# Patient Record
Sex: Female | Born: 1965 | Race: Black or African American | Hispanic: No | Marital: Married | Smoking: Never smoker
Health system: Southern US, Community
[De-identification: ages and names within clinical notes are randomized; demographics above are authoritative.]

## PROBLEM LIST (undated history)

## (undated) DIAGNOSIS — I10 Essential (primary) hypertension: Secondary | ICD-10-CM

## (undated) DIAGNOSIS — Z973 Presence of spectacles and contact lenses: Secondary | ICD-10-CM

## (undated) DIAGNOSIS — R011 Cardiac murmur, unspecified: Secondary | ICD-10-CM

## (undated) DIAGNOSIS — N200 Calculus of kidney: Secondary | ICD-10-CM

## (undated) DIAGNOSIS — Z8489 Family history of other specified conditions: Secondary | ICD-10-CM

## (undated) DIAGNOSIS — Z87442 Personal history of urinary calculi: Secondary | ICD-10-CM

## (undated) DIAGNOSIS — N201 Calculus of ureter: Secondary | ICD-10-CM

## (undated) DIAGNOSIS — G43909 Migraine, unspecified, not intractable, without status migrainosus: Secondary | ICD-10-CM

## (undated) HISTORY — DX: Calculus of kidney: N20.0

## (undated) HISTORY — PX: TONSILLECTOMY: SUR1361

---

## 2002-10-06 HISTORY — PX: ABDOMINAL HYSTERECTOMY: SHX81

## 2004-10-06 DIAGNOSIS — N200 Calculus of kidney: Secondary | ICD-10-CM

## 2004-10-06 HISTORY — DX: Calculus of kidney: N20.0

## 2014-04-17 ENCOUNTER — Encounter: Payer: Self-pay | Admitting: Internal Medicine

## 2014-06-23 ENCOUNTER — Ambulatory Visit: Payer: Self-pay | Admitting: Internal Medicine

## 2014-06-30 ENCOUNTER — Ambulatory Visit (INDEPENDENT_AMBULATORY_CARE_PROVIDER_SITE_OTHER): Payer: BC Managed Care – PPO | Admitting: Family Medicine

## 2014-06-30 ENCOUNTER — Ambulatory Visit (INDEPENDENT_AMBULATORY_CARE_PROVIDER_SITE_OTHER): Payer: BC Managed Care – PPO

## 2014-06-30 VITALS — BP 130/90 | HR 87 | Temp 98.3°F | Resp 16 | Ht 67.5 in | Wt 134.0 lb

## 2014-06-30 DIAGNOSIS — Z87442 Personal history of urinary calculi: Secondary | ICD-10-CM

## 2014-06-30 DIAGNOSIS — R109 Unspecified abdominal pain: Secondary | ICD-10-CM

## 2014-06-30 DIAGNOSIS — N201 Calculus of ureter: Secondary | ICD-10-CM

## 2014-06-30 LAB — POCT UA - MICROSCOPIC ONLY
Casts, Ur, LPF, POC: NEGATIVE
Crystals, Ur, HPF, POC: NEGATIVE
Mucus, UA: NEGATIVE
Yeast, UA: NEGATIVE

## 2014-06-30 LAB — POCT CBC
Granulocyte percent: 87 %G — AB (ref 37–80)
HCT, POC: 58.6 % — AB (ref 37.7–47.9)
Hemoglobin: 18.4 g/dL — AB (ref 12.2–16.2)
Lymph, poc: 0.4 — AB (ref 0.6–3.4)
MCH, POC: 27.2 pg (ref 27–31.2)
MCHC: 31.5 g/dL — AB (ref 31.8–35.4)
MCV: 86.3 fL (ref 80–97)
MID (CBC): 0.1 (ref 0–0.9)
MPV: 8.3 fL (ref 0–99.8)
POC Granulocyte: 3.6 (ref 2–6.9)
POC LYMPH PERCENT: 10 %L (ref 10–50)
POC MID %: 3 %M (ref 0–12)
Platelet Count, POC: 91 10*3/uL — AB (ref 142–424)
RBC: 6.79 M/uL — AB (ref 4.04–5.48)
RDW, POC: 16.1 %
WBC: 4.1 10*3/uL — AB (ref 4.6–10.2)

## 2014-06-30 LAB — POCT URINALYSIS DIPSTICK
Bilirubin, UA: NEGATIVE
Blood, UA: NEGATIVE
GLUCOSE UA: NEGATIVE
Ketones, UA: 40
Leukocytes, UA: NEGATIVE
NITRITE UA: NEGATIVE
Protein, UA: NEGATIVE
Spec Grav, UA: 1.025
UROBILINOGEN UA: 0.2
pH, UA: 5.5

## 2014-06-30 LAB — COMPLETE METABOLIC PANEL WITH GFR
ALBUMIN: 4.8 g/dL (ref 3.5–5.2)
ALK PHOS: 63 U/L (ref 39–117)
ALT: <8 U/L (ref 0–35)
AST: 19 U/L (ref 0–37)
BUN: 20 mg/dL (ref 6–23)
CO2: 28 meq/L (ref 19–32)
Calcium: 9.9 mg/dL (ref 8.4–10.5)
Chloride: 101 mEq/L (ref 96–112)
Creat: 1.26 mg/dL — ABNORMAL HIGH (ref 0.50–1.10)
GFR, EST AFRICAN AMERICAN: 57 mL/min — AB
GFR, EST NON AFRICAN AMERICAN: 50 mL/min — AB
GLUCOSE: 122 mg/dL — AB (ref 70–99)
Potassium: 4.3 mEq/L (ref 3.5–5.3)
SODIUM: 138 meq/L (ref 135–145)
Total Bilirubin: 0.4 mg/dL (ref 0.2–1.2)
Total Protein: 8.4 g/dL — ABNORMAL HIGH (ref 6.0–8.3)

## 2014-06-30 MED ORDER — HYDROCODONE-ACETAMINOPHEN 7.5-325 MG PO TABS: 1.0000 | ORAL_TABLET | Freq: Four times a day (QID) | ORAL | Status: DC | PRN

## 2014-06-30 MED ORDER — CIPROFLOXACIN HCL 500 MG PO TABS: 500.0000 mg | ORAL_TABLET | Freq: Two times a day (BID) | ORAL | Status: DC

## 2014-06-30 MED ORDER — TAMSULOSIN HCL 0.4 MG PO CAPS: 0.4000 mg | ORAL_CAPSULE | Freq: Every day | ORAL | Status: DC

## 2014-06-30 NOTE — Patient Instructions (Addendum)
Drink plenty of fluids to keep himself urinating very frequently  Take the Flomax 1 by mouth daily to try to relax no stones can pass  Strain the urine. If you should pass a stone bring it in so we can do a stone analysis on it.  Take the hydrocodone pain pills every 4-6 hours if needed for severe pain  Return in 2 days if not much better.  Return if worse at any time or go to the emergency room during the night time if needed.  Hold off on the colonoscopy if you're still having a lot of pain

## 2014-06-30 NOTE — Progress Notes (Addendum)
Subjective: 48 year old lady who has a history of kidney stones. She says she has about 11 stones in has only passed 2. She's been cared for in [place] in the past. The pain hit her in the night, up to a 9/10. She had some nausea. No hematuria. No dysuria. The pain is in her left flank and radiates down to the left lower abdomen. Last menstrual cycle was about 10 years ago.  Objective: No acute distress except for holding her flank. She says it has subsided a little right now. She has a TENS unit on. Spine nontender. Right CVA is nontender. Left CVA is quite tender. She also has tenderness in the left lower quadrant.  Her bowels did move during the night. Abdomen has active bowel sounds it is soft.  Assessment: History of kidney stones Left flank pain, presumably another kidney stone.  Plan: CBC, KUB, UA  Results for orders placed in visit on 06/30/14  POCT URINALYSIS DIPSTICK      Result Value Ref Range   Color, UA bright yellow     Clarity, UA clear     Glucose, UA neg     Bilirubin, UA neg     Ketones, UA 40     Spec Grav, UA 1.025     Blood, UA neg     pH, UA 5.5     Protein, UA neg     Urobilinogen, UA 0.2     Nitrite, UA neg     Leukocytes, UA Negative    POCT UA - MICROSCOPIC ONLY      Result Value Ref Range   WBC, Ur, HPF, POC 1-2     RBC, urine, microscopic 0-1     Bacteria, U Microscopic trace     Mucus, UA neg     Epithelial cells, urine per micros 1-3     Crystals, Ur, HPF, POC neg     Casts, Ur, LPF, POC neg     Yeast, UA neg    POCT CBC      Result Value Ref Range   WBC 4.1 (*) 4.6 - 10.2 K/uL   Lymph, poc 0.4 (*) 0.6 - 3.4   POC LYMPH PERCENT 10.0  10 - 50 %L   MID (cbc) 0.1  0 - 0.9   POC MID % 3.0  0 - 12 %M   POC Granulocyte 3.6  2 - 6.9   Granulocyte percent 87.0 (*) 37 - 80 %G   RBC 6.79 (*) 4.04 - 5.48 M/uL   Hemoglobin 18.4 (*) 12.2 - 16.2 g/dL   HCT, POC 16.1 (*) 09.6 - 47.9 %   MCV 86.3  80 - 97 fL   MCH, POC 27.2  27 - 31.2 pg   MCHC  31.5 (*) 31.8 - 35.4 g/dL   RDW, POC 04.5     Platelet Count, POC 91.0 (*) 142 - 424 K/uL   MPV 8.3  0 - 99.8 fL   [facility] reading (PRIMARY) by  Dr. Alwyn Ren Multiple stones in kidneys.  Left pelvic opacity probably distal ureteral stone.plus one other small calcification in pelvis..      Will strain her urine and see if she passes the stone.

## 2014-07-01 ENCOUNTER — Ambulatory Visit (HOSPITAL_COMMUNITY)
Admission: RE | Admit: 2014-07-01 | Discharge: 2014-07-01 | Disposition: A | Discharge status: Home / Self Care | Payer: BC Managed Care – PPO | Source: Ambulatory Visit | Attending: Family Medicine | Admitting: Family Medicine

## 2014-07-01 ENCOUNTER — Telehealth: Payer: Self-pay | Admitting: Radiology

## 2014-07-01 ENCOUNTER — Ambulatory Visit (INDEPENDENT_AMBULATORY_CARE_PROVIDER_SITE_OTHER): Payer: BC Managed Care – PPO | Admitting: Family Medicine

## 2014-07-01 ENCOUNTER — Other Ambulatory Visit: Payer: Self-pay | Admitting: Family Medicine

## 2014-07-01 VITALS — BP 138/88 | HR 77 | Temp 98.0°F

## 2014-07-01 DIAGNOSIS — N201 Calculus of ureter: Secondary | ICD-10-CM | POA: Diagnosis not present

## 2014-07-01 DIAGNOSIS — R110 Nausea: Secondary | ICD-10-CM

## 2014-07-01 DIAGNOSIS — R1013 Epigastric pain: Secondary | ICD-10-CM | POA: Diagnosis present

## 2014-07-01 DIAGNOSIS — E860 Dehydration: Secondary | ICD-10-CM

## 2014-07-01 LAB — POCT CBC
Granulocyte percent: 83.8 %G — AB (ref 37–80)
HEMATOCRIT: 41.8 % (ref 37.7–47.9)
Hemoglobin: 12.9 g/dL (ref 12.2–16.2)
Lymph, poc: 0.8 (ref 0.6–3.4)
MCH: 27 pg (ref 27–31.2)
MCHC: 30.8 g/dL — AB (ref 31.8–35.4)
MCV: 87.6 fL (ref 80–97)
MID (cbc): 0.4 (ref 0–0.9)
MPV: 8.7 fL (ref 0–99.8)
POC Granulocyte: 6.1 (ref 2–6.9)
POC LYMPH PERCENT: 10.3 %L (ref 10–50)
POC MID %: 5.9 %M (ref 0–12)
Platelet Count, POC: 174 10*3/uL (ref 142–424)
RBC: 4.77 M/uL (ref 4.04–5.48)
RDW, POC: 16.3 %
WBC: 7.3 10*3/uL (ref 4.6–10.2)

## 2014-07-01 LAB — POCT URINALYSIS DIPSTICK
Bilirubin, UA: NEGATIVE
GLUCOSE UA: NEGATIVE
KETONES UA: NEGATIVE
Leukocytes, UA: NEGATIVE
Nitrite, UA: NEGATIVE
RBC UA: NEGATIVE
Spec Grav, UA: >=1.03
UROBILINOGEN UA: 0.2
pH, UA: 5

## 2014-07-01 LAB — POCT UA - MICROSCOPIC ONLY
CRYSTALS, UR, HPF, POC: NEGATIVE
Casts, Ur, LPF, POC: NEGATIVE
MUCUS UA: POSITIVE
Yeast, UA: NEGATIVE

## 2014-07-01 MED ORDER — KETOROLAC TROMETHAMINE 60 MG/2ML IM SOLN
60.0000 mg | Freq: Once | INTRAMUSCULAR | Status: AC
  Administered 2014-07-01: 60 mg via INTRAMUSCULAR

## 2014-07-01 MED ORDER — ONDANSETRON 4 MG PO TBDP
4.0000 mg | ORAL_TABLET | Freq: Once | ORAL | Status: AC
  Administered 2014-07-01: 4 mg via ORAL

## 2014-07-01 NOTE — Telephone Encounter (Signed)
I got authorization for the CT scan, and patient went sat at 4:30pm, to you Lorain Childes, Authorization # [id number]

## 2014-07-01 NOTE — Patient Instructions (Addendum)
Go to [facility] for the CT scan, you need to register in the  Emergency  Room. Tell them you are there to register as Outpatient for CT Scan/ you may have to wait for the scan, as they have a high patient volume.  Your insurance has authorized the scan the authorization number is [id number]  Take the ciprofloxacin antibiotic twice daily. It was sent to your pharmacy last night but that was after you have gotten your medicine already I'm afraid.  Continue using the pain pills about every 4 hours as needed  You can take Aleve 2 pills twice daily in addition to the pain pills if necessary.  I have seen him a prescription for Zofran (ondansetron) that you can take every 4-6 hours as needed for nausea  Continue to try and drink lots of fluids  Return on Monday if not much better. Return at any time or go to the emergency room if worse

## 2014-07-01 NOTE — Progress Notes (Addendum)
Subjective: Patient has continued to have problems with pain in her left lower abdomen and left flank. She is nauseated. Has not been able to drink much. She rode in the car to [place] back this morning, which triggered the pain being much worse. She came in here hardly able to stand or sit up. She is holding her left side. I had seen her yesterday.  Objective: Alert and oriented. Abdomen soft. Very tender left abdomen. Moderately tender left flank.  Assessment: Left ureterolithiasis Pain Nausea Dehydration  Plan IVFluids toradol 60 im zofran 4  Results for orders placed in visit on 07/01/14  POCT CBC      Result Value Ref Range   WBC 7.3  4.6 - 10.2 K/uL   Lymph, poc 0.8  0.6 - 3.4   POC LYMPH PERCENT 10.3  10 - 50 %L   MID (cbc) 0.4  0 - 0.9   POC MID % 5.9  0 - 12 %M   POC Granulocyte 6.1  2 - 6.9   Granulocyte percent 83.8 (*) 37 - 80 %G   RBC 4.77  4.04 - 5.48 M/uL   Hemoglobin 12.9  12.2 - 16.2 g/dL   HCT, POC 08.6  57.8 - 47.9 %   MCV 87.6  80 - 97 fL   MCH, POC 27.0  27 - 31.2 pg   MCHC 30.8 (*) 31.8 - 35.4 g/dL   RDW, POC 46.9     Platelet Count, POC 174  142 - 424 K/uL   MPV 8.7  0 - 99.8 fL  POCT URINALYSIS DIPSTICK      Result Value Ref Range   Color, UA dark yellow     Clarity, UA hazy     Glucose, UA neg     Bilirubin, UA neg     Ketones, UA neg     Spec Grav, UA >=1.030     Blood, UA neg     pH, UA 5.0     Protein, UA trace     Urobilinogen, UA 0.2     Nitrite, UA neg     Leukocytes, UA Negative    POCT UA - MICROSCOPIC ONLY      Result Value Ref Range   WBC, Ur, HPF, POC 3-5     RBC, urine, microscopic 5-7     Bacteria, U Microscopic small     Mucus, UA positive     Epithelial cells, urine per micros 2-6     Crystals, Ur, HPF, POC neg     Casts, Ur, LPF, POC neg     Yeast, UA neg     Renal tubular cells       We'll send her to the hospital for an urgent CT  See instructions  Patient received care for 2 hours and 45 minutes with  numerous personal physician checks involving greater than 45 minutes face-to-face time.    Called patient back at 10 PM.  Pain gone.  CT consistent with having passed a stone.  Return if at all worse.  Try to strain urine 1-2 days.

## 2014-07-03 ENCOUNTER — Telehealth: Payer: Self-pay

## 2014-07-03 NOTE — Telephone Encounter (Signed)
I do not know specifically what else to do for her if she is continuing to have pain. I think we need to refer her on to a urologist and try and get her in there in the next day or 2 if at all possible.

## 2014-07-03 NOTE — Telephone Encounter (Signed)
Spoke to pt- she is still in pain not as bad as it was. She did not want to take the Norco because it makes her sleepy. Advised her to try just OTC Tylenol or Ibuprofen and take the Hydrocodone at night or if in severe pain. She states she has not passed any stones yet. She has been screening her urine each time.   Would pyridium help pt?

## 2014-07-03 NOTE — Telephone Encounter (Signed)
Pt would like Dr Alwyn Ren to know she is not doing well,still in a lot of pain   Best phone for pt is [phone]

## 2014-07-03 NOTE — Telephone Encounter (Signed)
Pyridium can help with any burning with urination and with the sense of urgency.

## 2014-07-04 ENCOUNTER — Telehealth: Payer: Self-pay | Admitting: Radiology

## 2014-07-04 NOTE — Telephone Encounter (Signed)
Pt is going to try the OTC Pyridium for a day or two and come back in if she is not improving.

## 2014-07-04 NOTE — Telephone Encounter (Signed)
Pt collected a kidney stone and brought it in. Kidney stone analysis ordered under last OV.

## 2014-07-04 NOTE — Addendum Note (Signed)
Addended byLevon Hedger: CLINE, REBEKAH A on: 07/04/2014 02:31 PM   Modules accepted: Orders

## 2014-07-05 ENCOUNTER — Ambulatory Visit (INDEPENDENT_AMBULATORY_CARE_PROVIDER_SITE_OTHER): Payer: BC Managed Care – PPO | Admitting: Family Medicine

## 2014-07-05 ENCOUNTER — Telehealth: Payer: Self-pay

## 2014-07-05 VITALS — BP 124/86 | HR 99 | Temp 98.3°F | Resp 16 | Ht 67.5 in | Wt 135.6 lb

## 2014-07-05 DIAGNOSIS — R1013 Epigastric pain: Secondary | ICD-10-CM

## 2014-07-05 DIAGNOSIS — R8281 Pyuria: Secondary | ICD-10-CM

## 2014-07-05 DIAGNOSIS — R109 Unspecified abdominal pain: Secondary | ICD-10-CM

## 2014-07-05 DIAGNOSIS — Z87442 Personal history of urinary calculi: Secondary | ICD-10-CM

## 2014-07-05 DIAGNOSIS — K7689 Other specified diseases of liver: Secondary | ICD-10-CM

## 2014-07-05 DIAGNOSIS — R82998 Other abnormal findings in urine: Secondary | ICD-10-CM

## 2014-07-05 LAB — POCT CBC
Granulocyte percent: 60.7 %G (ref 37–80)
HCT, POC: 38.5 % (ref 37.7–47.9)
Hemoglobin: 12 g/dL — AB (ref 12.2–16.2)
Lymph, poc: 1.1 (ref 0.6–3.4)
MCH, POC: 27.1 pg (ref 27–31.2)
MCHC: 31.2 g/dL — AB (ref 31.8–35.4)
MCV: 87 fL (ref 80–97)
MID (CBC): 0.4 (ref 0–0.9)
MPV: 7.5 fL (ref 0–99.8)
PLATELET COUNT, POC: 204 10*3/uL (ref 142–424)
POC Granulocyte: 2.4 (ref 2–6.9)
POC LYMPH PERCENT: 28.3 %L (ref 10–50)
POC MID %: 11 % (ref 0–12)
RBC: 4.43 M/uL (ref 4.04–5.48)
RDW, POC: 15.5 %
WBC: 4 10*3/uL — AB (ref 4.6–10.2)

## 2014-07-05 LAB — POCT URINALYSIS DIPSTICK
BILIRUBIN UA: NEGATIVE
Glucose, UA: NEGATIVE
KETONES UA: NEGATIVE
Leukocytes, UA: NEGATIVE
NITRITE UA: NEGATIVE
PH UA: 5.5
PROTEIN UA: 100
Spec Grav, UA: 1.025
Urobilinogen, UA: 0.2

## 2014-07-05 LAB — POCT UA - MICROSCOPIC ONLY
CASTS, UR, LPF, POC: NEGATIVE
Crystals, Ur, HPF, POC: NEGATIVE
MUCUS UA: NEGATIVE

## 2014-07-05 MED ORDER — NITROFURANTOIN MONOHYD MACRO 100 MG PO CAPS: 100.0000 mg | ORAL_CAPSULE | Freq: Two times a day (BID) | ORAL | Status: DC

## 2014-07-05 NOTE — Telephone Encounter (Signed)
Spoke to pt- she believes the Cipro is causing the hives. She has stopped medication. Pt is still having urinary symptoms and will be rtc today to see Dr. Alwyn RenHopper.

## 2014-07-05 NOTE — Addendum Note (Signed)
Addended by: Nita SellsSMITH, Kaleena Corrow S on: 07/05/2014 03:54 PM   Modules accepted: Orders

## 2014-07-05 NOTE — Patient Instructions (Signed)
Continue to drink plenty of fluids  Take Macrobid (nitrofurantoin) one twice daily with breakfast and supper  If symptoms not improved substantially by next week please return or contact me and I can make referral to a urologist.  If you're doing well, please come back in early November for a recheck of your urine and blood work.

## 2014-07-05 NOTE — Progress Notes (Addendum)
Subjective: Has felt a lot better than she continues to hurt her left side. She broke out in a rash after 3 doses of Cipro. That rash has almost subsided, but she came back in for several things. She continues to hurt a little bit on the left side. Not hurting in the flank. No hematuria or dysuria or fever or chills.  Objective: No CVA tenderness. Abdomen soft. Mild to moderate tenderness of the left lower quadrant and lateral abdomen.    Results for orders placed in visit on 07/05/14  POCT CBC      Result Value Ref Range   WBC 4.0 (*) 4.6 - 10.2 K/uL   Lymph, poc 1.1  0.6 - 3.4   POC LYMPH PERCENT 28.3  10 - 50 %L   MID (cbc) 0.4  0 - 0.9   POC MID % 11.0  0 - 12 %M   POC Granulocyte 2.4  2 - 6.9   Granulocyte percent 60.7  37 - 80 %G   RBC 4.43  4.04 - 5.48 M/uL   Hemoglobin 12.0 (*) 12.2 - 16.2 g/dL   HCT, POC 78.238.5  95.637.7 - 47.9 %   MCV 87.0  80 - 97 fL   MCH, POC 27.1  27 - 31.2 pg   MCHC 31.2 (*) 31.8 - 35.4 g/dL   RDW, POC 21.315.5     Platelet Count, POC 204  142 - 424 K/uL   MPV 7.5  0 - 99.8 fL  POCT URINALYSIS DIPSTICK      Result Value Ref Range   Color, UA yellow     Clarity, UA clear     Glucose, UA neg     Bilirubin, UA neg     Ketones, UA neg     Spec Grav, UA 1.025     Blood, UA moderate     pH, UA 5.5     Protein, UA 100     Urobilinogen, UA 0.2     Nitrite, UA neg     Leukocytes, UA Negative    POCT UA - MICROSCOPIC ONLY      Result Value Ref Range   WBC, Ur, HPF, POC 4-10     RBC, urine, microscopic 10-40     Bacteria, U Microscopic small     Mucus, UA neg     Epithelial cells, urine per micros 2-3     Crystals, Ur, HPF, POC neg     Casts, Ur, LPF, POC neg     Yeast, UA eng     Assessment: Status post kidney stone Left lower quadrant abdominal pain Microscopic hematuria Mild pyuria Liver cysts  Plan: Continue her on Macrobid for a possible infection. Reculture her urine. Have her return in early November. Come back if she's not improving. I  think this is all post stone pain with a little inflammation still in the ureter from the stone, though I am not certain of that. If she is doing worse or persisting we consider a urologist.  Explained to her about the small cysts in her liver with. These are benign in appearance and did not pursue work this time. Next week.

## 2014-07-05 NOTE — Telephone Encounter (Signed)
Patient says she is having a reaction to her newly prescribed medication. She says she is breaking out in small itchy bumps. I advised patient to RTC, she declined.

## 2014-07-06 LAB — URINE CULTURE
Colony Count: NO GROWTH
ORGANISM ID, BACTERIA: NO GROWTH

## 2014-07-08 LAB — STONE ANALYSIS: Stone Weight KSTONE: 0.028 g

## 2015-02-28 ENCOUNTER — Ambulatory Visit (INDEPENDENT_AMBULATORY_CARE_PROVIDER_SITE_OTHER): Payer: Worker's Compensation | Admitting: Family Medicine

## 2015-02-28 ENCOUNTER — Ambulatory Visit: Payer: Worker's Compensation

## 2015-02-28 VITALS — BP 128/82 | HR 83 | Temp 98.1°F | Resp 16 | Ht 68.0 in | Wt 139.0 lb

## 2015-02-28 DIAGNOSIS — M25562 Pain in left knee: Secondary | ICD-10-CM

## 2015-02-28 DIAGNOSIS — S8002XA Contusion of left knee, initial encounter: Secondary | ICD-10-CM

## 2015-02-28 NOTE — Progress Notes (Signed)
Rose Wilson January 04, 1966 49 y.o.   Chief Complaint  Patient presents with  . Knee Injury    l knee workers comp     Date of Injury: yesterday- 02/27/2015  History of Present Illness:  Presents for evaluation of work-related complaint. Yesterday at work she missed the last step while walking to the parking lot and fell.  She was holding on to the railing and is not sure quite how she fell.  Landed on her left knee.  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  She thought she was ok, but the knee hurt more as the night went on and she felt it needed to be looked at today.   She notes a little bit of swelling.  She has tried ice, she is able to walk but it hurts to go up stairs.  No clicking or popping, no locking up.  The knee feels a bit unstable when he has to get up from sitting to standing   She does do a lot of walking at her job.  She works for the [employer].  She is generally in good health   ROS    Allergies  Allergen Reactions  . Ciprofloxacin Hcl      Current medications reviewed and updated. Past medical history, family history, social history have been reviewed and updated.   Physical Exam  GEN: WDWN, NAD, Non-toxic, A & O x 3, looks well HEENT: Atraumatic, Normocephalic. Neck supple. No masses, No LAD. Ears and Nose: No external deformity. CV: RRR, No M/G/R. No JVD. No thrill. No extra heart sounds. PULM: CTA B, no wheezes, crackles, rhonchi. No retractions. No resp. distress. No accessory muscle use. EXTR: No c/c/e NEURO Normal gait. Not visibly favoring left PSYCH: Normally interactive. Conversant. Not depressed or anxious appearing.  Calm demeanor.  Left knee: she has a contusion of the very proximal tibia just under the joint line, more medial aspect.  This is actually where she hurts.  Normal ROM of the knee but she does have mild crepitus bilaterally.  Knee is stable, no laxity.  No effusion.  Minimal swelling at the site of contusion   [facility] reading (PRIMARY) by  Dr.  Patsy Lageropland. Left knee: negative  LEFT KNEE - COMPLETE 4+ VIEW  COMPARISON: None.  FINDINGS: The knee joint spaces appear normal. No fracture is seen. No joint effusion is noted. The patella is normally positioned.  IMPRESSION: Negative.  Assessment and Plan: Contusion of left knee, initial encounter - Plan: DG Knee Complete 4 Views Left  Pain in left knee - Plan: DG Knee Complete 4 Views Left  Pain and contusion of left knee from a fall yesterday.  Restrictions at work, NSAIDs, ice, recheck in one week.

## 2015-02-28 NOTE — Patient Instructions (Signed)
Take it easy on your knee- avoid activities that hurt your knee.   Avoid climbing stairs.   Apply ice a couple of times a day and try NSAIDs such as aleve or ibuprofen Please see me in one week for a recheck

## 2015-03-07 ENCOUNTER — Ambulatory Visit (INDEPENDENT_AMBULATORY_CARE_PROVIDER_SITE_OTHER): Payer: Worker's Compensation | Admitting: Family Medicine

## 2015-03-07 VITALS — BP 122/74 | HR 81 | Temp 99.0°F | Resp 17 | Ht 68.5 in | Wt 140.0 lb

## 2015-03-07 DIAGNOSIS — S86912D Strain of unspecified muscle(s) and tendon(s) at lower leg level, left leg, subsequent encounter: Secondary | ICD-10-CM

## 2015-03-07 DIAGNOSIS — S86812D Strain of other muscle(s) and tendon(s) at lower leg level, left leg, subsequent encounter: Secondary | ICD-10-CM | POA: Diagnosis not present

## 2015-03-07 NOTE — Patient Instructions (Signed)
I am glad that your knee is improved!  Please let me know if you have any recurrent problems with your knee

## 2015-03-07 NOTE — Progress Notes (Signed)
Rose Wilson [birth year] 49 y.o.   Chief Complaint  Patient presents with  . Follow-up    knee pain      Date of Injury: 02/26/14  History of Present Illness:  Presents for evaluation of work-related complaint. Seen here on 5/25- the day prior she missed a step and fell onto her left knee.  She had negative films.  Gave work restrions, RICE.  She states that her left knee is just a little bit sore now, but "iIm ok."   No clicking, popping or getting stuck, no instability.  She would like to return to full duty   ROS    Allergies  Allergen Reactions  . Ciprofloxacin Hcl      Current medications reviewed and updated. Past medical history, family history, social history have been reviewed and updated.   Physical Exam  GEN: WDWN, NAD, Non-toxic, A & O x 3, looks well HEENT: Atraumatic, Normocephalic. Neck supple. No masses, No LAD. Ears and Nose: No external deformity. CV: RRR, No M/G/R. No JVD. No thrill. No extra heart sounds. PULM: CTA B, no wheezes, crackles, rhonchi. No retractions. No resp. distress. No accessory muscle use. EXTR: No c/c/e NEURO Normal gait.  PSYCH: Normally interactive. Conversant. Not depressed or anxious appearing.  Calm demeanor.  Left knee: no tenderness at joint line to palpation.  Normal ligament stability. No effusion, redness or swelling, no heat  Assessment and Plan: Knee strain, left, subsequent encounter  Much improved- she may return to full duty.  Follow-up if needed

## 2016-06-26 ENCOUNTER — Other Ambulatory Visit: Payer: Self-pay | Admitting: Specialist

## 2016-06-26 DIAGNOSIS — R519 Headache, unspecified: Secondary | ICD-10-CM

## 2016-06-26 DIAGNOSIS — R51 Headache: Principal | ICD-10-CM

## 2016-07-04 ENCOUNTER — Other Ambulatory Visit: Payer: Self-pay

## 2016-08-09 ENCOUNTER — Inpatient Hospital Stay: Admission: RE | Admit: 2016-08-09 | Payer: Self-pay | Source: Ambulatory Visit

## 2017-04-07 ENCOUNTER — Other Ambulatory Visit: Payer: Self-pay | Admitting: Gynecology

## 2017-04-07 DIAGNOSIS — N9489 Other specified conditions associated with female genital organs and menstrual cycle: Secondary | ICD-10-CM

## 2017-04-15 ENCOUNTER — Ambulatory Visit
Admission: RE | Admit: 2017-04-15 | Discharge: 2017-04-15 | Disposition: A | Discharge status: Home / Self Care | Payer: BLUE CROSS/BLUE SHIELD | Source: Ambulatory Visit | Attending: Gynecology | Admitting: Gynecology

## 2017-04-15 DIAGNOSIS — N9489 Other specified conditions associated with female genital organs and menstrual cycle: Secondary | ICD-10-CM

## 2017-07-31 ENCOUNTER — Other Ambulatory Visit: Payer: Self-pay | Admitting: Internal Medicine

## 2017-07-31 ENCOUNTER — Ambulatory Visit
Admission: RE | Admit: 2017-07-31 | Discharge: 2017-07-31 | Disposition: A | Discharge status: Home / Self Care | Payer: BLUE CROSS/BLUE SHIELD | Source: Ambulatory Visit | Attending: Internal Medicine | Admitting: Internal Medicine

## 2017-07-31 DIAGNOSIS — Z0000 Encounter for general adult medical examination without abnormal findings: Secondary | ICD-10-CM

## 2018-08-20 ENCOUNTER — Ambulatory Visit
Admission: RE | Admit: 2018-08-20 | Discharge: 2018-08-20 | Disposition: A | Discharge status: Home / Self Care | Payer: BLUE CROSS/BLUE SHIELD | Source: Ambulatory Visit | Attending: Physician Assistant | Admitting: Physician Assistant

## 2018-08-20 ENCOUNTER — Other Ambulatory Visit: Payer: Self-pay | Admitting: Physician Assistant

## 2018-08-20 DIAGNOSIS — R109 Unspecified abdominal pain: Secondary | ICD-10-CM

## 2018-08-23 ENCOUNTER — Encounter (HOSPITAL_COMMUNITY)
Admission: RE | Disposition: A | Discharge status: Home / Self Care | Payer: Self-pay | Source: Ambulatory Visit | Attending: Urology

## 2018-08-23 ENCOUNTER — Ambulatory Visit (HOSPITAL_COMMUNITY): Payer: BLUE CROSS/BLUE SHIELD | Admitting: Anesthesiology

## 2018-08-23 ENCOUNTER — Encounter (HOSPITAL_COMMUNITY): Payer: Self-pay | Admitting: *Deleted

## 2018-08-23 ENCOUNTER — Ambulatory Visit (HOSPITAL_COMMUNITY)
Admission: RE | Admit: 2018-08-23 | Discharge: 2018-08-23 | Disposition: A | Discharge status: Home / Self Care | Payer: BLUE CROSS/BLUE SHIELD | Source: Ambulatory Visit | Attending: Urology | Admitting: Urology

## 2018-08-23 ENCOUNTER — Ambulatory Visit (HOSPITAL_COMMUNITY): Payer: BLUE CROSS/BLUE SHIELD

## 2018-08-23 ENCOUNTER — Other Ambulatory Visit: Payer: Self-pay | Admitting: Urology

## 2018-08-23 DIAGNOSIS — N201 Calculus of ureter: Secondary | ICD-10-CM | POA: Diagnosis present

## 2018-08-23 DIAGNOSIS — N202 Calculus of kidney with calculus of ureter: Secondary | ICD-10-CM | POA: Insufficient documentation

## 2018-08-23 HISTORY — DX: Cardiac murmur, unspecified: R01.1

## 2018-08-23 HISTORY — DX: Personal history of urinary calculi: Z87.442

## 2018-08-23 HISTORY — PX: CYSTOSCOPY W/ URETERAL STENT PLACEMENT: SHX1429

## 2018-08-23 HISTORY — DX: Family history of other specified conditions: Z84.89

## 2018-08-23 SURGERY — CYSTOSCOPY, WITH RETROGRADE PYELOGRAM AND URETERAL STENT INSERTION
Anesthesia: General | Site: Ureter | Laterality: Left

## 2018-08-23 MED ORDER — MIDAZOLAM HCL 2 MG/2ML IJ SOLN
INTRAMUSCULAR | Status: AC
  Filled 2018-08-23: qty 2

## 2018-08-23 MED ORDER — ONDANSETRON HCL 4 MG/2ML IJ SOLN
INTRAMUSCULAR | Status: AC
  Filled 2018-08-23: qty 2

## 2018-08-23 MED ORDER — ONDANSETRON HCL 4 MG/2ML IJ SOLN
INTRAMUSCULAR | Status: DC | PRN
  Administered 2018-08-23: 4 mg via INTRAVENOUS

## 2018-08-23 MED ORDER — SODIUM CHLORIDE 0.9 % IR SOLN
Status: DC | PRN
  Administered 2018-08-23: 4000 mL

## 2018-08-23 MED ORDER — MIDAZOLAM HCL 5 MG/5ML IJ SOLN
INTRAMUSCULAR | Status: DC | PRN
  Administered 2018-08-23: 2 mg via INTRAVENOUS

## 2018-08-23 MED ORDER — CEPHALEXIN 500 MG PO CAPS: 500.0000 mg | ORAL_CAPSULE | Freq: Two times a day (BID) | ORAL | 0 refills | Status: AC

## 2018-08-23 MED ORDER — FENTANYL CITRATE (PF) 100 MCG/2ML IJ SOLN
INTRAMUSCULAR | Status: AC
  Filled 2018-08-23: qty 2

## 2018-08-23 MED ORDER — SCOPOLAMINE 1 MG/3DAYS TD PT72
MEDICATED_PATCH | TRANSDERMAL | Status: DC | PRN
  Administered 2018-08-23: 1 via TRANSDERMAL

## 2018-08-23 MED ORDER — PROPOFOL 10 MG/ML IV BOLUS
INTRAVENOUS | Status: DC | PRN
  Administered 2018-08-23: 160 mg via INTRAVENOUS

## 2018-08-23 MED ORDER — DIPHENHYDRAMINE HCL 50 MG/ML IJ SOLN
INTRAMUSCULAR | Status: DC | PRN
  Administered 2018-08-23: 12.5 mg via INTRAVENOUS

## 2018-08-23 MED ORDER — DEXAMETHASONE SODIUM PHOSPHATE 10 MG/ML IJ SOLN
INTRAMUSCULAR | Status: DC | PRN
  Administered 2018-08-23: 10 mg via INTRAVENOUS

## 2018-08-23 MED ORDER — PROPOFOL 10 MG/ML IV BOLUS
INTRAVENOUS | Status: AC
  Filled 2018-08-23: qty 20

## 2018-08-23 MED ORDER — LIDOCAINE 2% (20 MG/ML) 5 ML SYRINGE
INTRAMUSCULAR | Status: DC | PRN
  Administered 2018-08-23: 100 mg via INTRAVENOUS

## 2018-08-23 MED ORDER — SCOPOLAMINE 1 MG/3DAYS TD PT72
MEDICATED_PATCH | TRANSDERMAL | Status: AC
  Filled 2018-08-23: qty 1

## 2018-08-23 MED ORDER — HYDROMORPHONE HCL 1 MG/ML IJ SOLN: 0.2500 mg | INTRAMUSCULAR | Status: DC | PRN

## 2018-08-23 MED ORDER — SODIUM CHLORIDE 0.9 % IV SOLN
INTRAVENOUS | Status: DC | PRN
  Administered 2018-08-23: 10 mL

## 2018-08-23 MED ORDER — LACTATED RINGERS IV SOLN
INTRAVENOUS | Status: DC
Start: 2018-08-23 — End: 2018-08-24
  Administered 2018-08-23: 16:00:00 via INTRAVENOUS

## 2018-08-23 MED ORDER — FENTANYL CITRATE (PF) 100 MCG/2ML IJ SOLN
INTRAMUSCULAR | Status: DC | PRN
  Administered 2018-08-23 (×2): 50 ug via INTRAVENOUS

## 2018-08-23 MED ORDER — DEXAMETHASONE SODIUM PHOSPHATE 10 MG/ML IJ SOLN
INTRAMUSCULAR | Status: AC
  Filled 2018-08-23: qty 1

## 2018-08-23 MED ORDER — 0.9 % SODIUM CHLORIDE (POUR BTL) OPTIME
TOPICAL | Status: DC | PRN
  Administered 2018-08-23: 1000 mL

## 2018-08-23 MED ORDER — LIP MEDEX EX OINT
TOPICAL_OINTMENT | CUTANEOUS | Status: AC
  Filled 2018-08-23: qty 7

## 2018-08-23 MED ORDER — CEFAZOLIN SODIUM-DEXTROSE 2-4 GM/100ML-% IV SOLN
2.0000 g | INTRAVENOUS | Status: AC
  Administered 2018-08-23: 2 g via INTRAVENOUS
  Filled 2018-08-23: qty 100

## 2018-08-23 MED ORDER — PROMETHAZINE HCL 25 MG/ML IJ SOLN: 6.2500 mg | INTRAMUSCULAR | Status: DC | PRN

## 2018-08-23 MED ORDER — DIPHENHYDRAMINE HCL 50 MG/ML IJ SOLN
INTRAMUSCULAR | Status: AC
  Filled 2018-08-23: qty 1

## 2018-08-23 SURGICAL SUPPLY — 19 items
BAG URO CATCHER STRL LF (MISCELLANEOUS) ×3 IMPLANT
CATH URET 5FR 28IN OPEN ENDED (CATHETERS) ×3 IMPLANT
CLOTH BEACON ORANGE TIMEOUT ST (SAFETY) ×3 IMPLANT
DRSG TEGADERM 4X4.75 (GAUZE/BANDAGES/DRESSINGS) ×3 IMPLANT
EXTRACTOR STONE NITINOL NGAGE (UROLOGICAL SUPPLIES) ×3 IMPLANT
GLOVE BIOGEL PI IND STRL 7.5 (GLOVE) ×2 IMPLANT
GLOVE BIOGEL PI INDICATOR 7.5 (GLOVE) ×4
GLOVE SURG SS PI 7.5 STRL IVOR (GLOVE) ×3 IMPLANT
GLOVE SURG SS PI 8.0 STRL IVOR (GLOVE) ×3 IMPLANT
GOWN SPEC L3 XXLG W/TWL (GOWN DISPOSABLE) ×3 IMPLANT
GOWN STRL REUS W/TWL XL LVL3 (GOWN DISPOSABLE) ×3 IMPLANT
GUIDEWIRE STR DUAL SENSOR (WIRE) ×6 IMPLANT
KIT BALLN UROMAX 15FX4 (MISCELLANEOUS) ×1 IMPLANT
KIT BALLN UROMAX 26 75X4 (MISCELLANEOUS) ×2
MANIFOLD NEPTUNE II (INSTRUMENTS) ×3 IMPLANT
PACK CYSTO (CUSTOM PROCEDURE TRAY) ×3 IMPLANT
STENT URET 6FRX24 CONTOUR (STENTS) ×3 IMPLANT
TUBING CONNECTING 10 (TUBING) ×2 IMPLANT
TUBING CONNECTING 10' (TUBING) ×1

## 2018-08-23 NOTE — Anesthesia Preprocedure Evaluation (Addendum)
Anesthesia Evaluation  Patient identified by MRN, date of birth, ID band Patient awake    Reviewed: Allergy & Precautions, NPO status , Patient's Chart, lab work & pertinent test results  History of Anesthesia Complications Negative for: history of anesthetic complications  Airway Mallampati: II  TM Distance: >3 FB Neck ROM: Full    Dental no notable dental hx. (+) Dental Advisory Given   Pulmonary neg pulmonary ROS,    Pulmonary exam normal        Cardiovascular negative cardio ROS Normal cardiovascular exam     Neuro/Psych negative neurological ROS  negative psych ROS   GI/Hepatic negative GI ROS, Neg liver ROS,   Endo/Other  negative endocrine ROS  Renal/GU   negative genitourinary   Musculoskeletal negative musculoskeletal ROS (+)   Abdominal   Peds negative pediatric ROS (+)  Hematology negative hematology ROS (+)   Anesthesia Other Findings   Reproductive/Obstetrics negative OB ROS                            Anesthesia Physical Anesthesia Plan  ASA: II  Anesthesia Plan: General   Post-op Pain Management:    Induction: Intravenous  PONV Risk Score and Plan: 4 or greater and Ondansetron, Dexamethasone, Scopolamine patch - Pre-op and Diphenhydramine  Airway Management Planned: LMA  Additional Equipment:   Intra-op Plan:   Post-operative Plan: Extubation in OR  Informed Consent: I have reviewed the patients History and Physical, chart, labs and discussed the procedure including the risks, benefits and alternatives for the proposed anesthesia with the patient or authorized representative who has indicated his/her understanding and acceptance.   Dental advisory given  Plan Discussed with: CRNA and Anesthesiologist  Anesthesia Plan Comments:        Anesthesia Quick Evaluation

## 2018-08-23 NOTE — Anesthesia Procedure Notes (Signed)
Procedure Name: LMA Insertion Date/Time: 08/23/2018 5:36 PM Performed by: Avory Rahimi D, CRNA Pre-anesthesia Checklist: Patient identified, Emergency Drugs available, Suction available and Patient being monitored Patient Re-evaluated:Patient Re-evaluated prior to induction Oxygen Delivery Method: Circle system utilized Preoxygenation: Pre-oxygenation with 100% oxygen Induction Type: IV induction Ventilation: Mask ventilation without difficulty LMA: LMA inserted LMA Size: 4.0 Tube type: Oral Number of attempts: 1 Placement Confirmation: positive ETCO2 and breath sounds checked- equal and bilateral Tube secured with: Tape Dental Injury: Teeth and Oropharynx as per pre-operative assessment

## 2018-08-23 NOTE — Anesthesia Postprocedure Evaluation (Signed)
Anesthesia Post Note  Patient: Rose Wilson  Procedure(s) Performed: CYSTOSCOPY, URETEROSCOPY WITH STONE EXCAVATION AND LEFT URETERAL STENT PLACEMENT (Left Ureter)     Patient location during evaluation: PACU Anesthesia Type: General Level of consciousness: sedated Pain management: pain level controlled Vital Signs Assessment: post-procedure vital signs reviewed and stable Respiratory status: spontaneous breathing and respiratory function stable Cardiovascular status: stable Postop Assessment: no apparent nausea or vomiting Anesthetic complications: no    Last Vitals:  Vitals:   08/23/18 1900 08/23/18 1946  BP: 128/80 123/73  Pulse: 81 87  Resp: 15   Temp: 37 C 37.2 C  SpO2: 95% 96%    Last Pain:  Vitals:   08/23/18 1946  TempSrc:   PainSc: 0-No pain                 Ezell Poke DANIEL

## 2018-08-23 NOTE — Discharge Instructions (Signed)
1. You may see some blood in the urine and may have some burning with urination for 48-72 hours. You also may notice that you have to urinate more frequently or urgently after your procedure which is normal.  2. You should call the office [phone]) if you develop an inability urinate, fever > 101, persistent nausea and vomiting that prevents you from eating or drinking to stay hydrated.  3. If you have a stent, you will likely urinate more frequently and urgently until the stent is removed and you may experience some discomfort/pain in the lower abdomen and flank especially when urinating. You may take pain medication prescribed to you if needed for pain. You may also intermittently have blood in the urine until the stent is removed. 4. On Thursday morning, remove the stent by pulling on the string. If you are not comfortable removing your stent at home, please call our clinic to arrange an appointment to have a nurse remove the stent. Ureteral stents are temporary and, if left in place, permanent damage can occur. If you are unable to find the string, call our office to come in and have the stent removed.

## 2018-08-23 NOTE — Transfer of Care (Signed)
Immediate Anesthesia Transfer of Care Note  Patient: Rose Wilson  Procedure(s) Performed: CYSTOSCOPY, URETEROSCOPY WITH STONE EXCAVATION AND LEFT URETERAL STENT PLACEMENT (Left Ureter)  Patient Location: PACU  Anesthesia Type:General  Level of Consciousness: awake  Airway & Oxygen Therapy: Patient Spontanous Breathing and Patient connected to face mask oxygen  Post-op Assessment: Report given to RN and Post -op Vital signs reviewed and stable  Post vital signs: Reviewed and stable  Last Vitals:  Vitals Value Taken Time  BP 130/92 08/23/2018  6:24 PM  Temp 37.2 C 08/23/2018  6:24 PM  Pulse 82 08/23/2018  6:25 PM  Resp 19 08/23/2018  6:25 PM  SpO2 100 % 08/23/2018  6:25 PM  Vitals shown include unvalidated device data.  Last Pain:  Vitals:   08/23/18 1824  TempSrc:   PainSc: (P) 0-No pain         Complications: No apparent anesthesia complications

## 2018-08-23 NOTE — H&P (Signed)
CC: I have ureteral stone.  HPI: Rose Wilson is a 52 year-old female patient who was referred by Dr. Margaretha Sheffield, MD who is here for ureteral stone.  The problem is on the left side.   Rose Wilson is a 52 yo female who is sent in consultation by Shanon Rosser PA. She had the onset of left flank pain that was severe on 08/16/18. She has a prior history of stones. A CT on 11/15 showed a 50m left distal stone. She is uncomfortable today with nausea. She has had no hematuria or voiding symptoms. She has had no fever.      ALLERGIES: No Allergies    MEDICATIONS: Tamsulosin Hcl 0.4 mg capsule  Hydrocodone-Acetaminophen 7.5 mg-325 mg tablet     GU PSH: Hysterectomy Unilat SO - 2009      PSH Notes: Hysterectomy   NON-GU PSH: None   GU PMH: Chronic cystitis (w/o hematuria), Chronic cystitis - 2014 Renal calculus, Nephrolithiasis - 2014 Ureteral calculus, Calculus of ureter - 2014      PMH Notes:  1898-10-06 00:00:00 - Note: Normal Routine History And Physical Adult   NON-GU PMH: None   FAMILY HISTORY: Diverticulitis Of Colon - Mother Family Health Status - Father alive at age 54- R65In Family Family Health Status - Mother's Age - Runs In Family Family Health Status Number - Runs In Family Hypertension - Father, Mother, Daughter nephrolithiasis - Mother Pure Hypercholesterolemia - Mother   SOCIAL HISTORY: Marital Status: Married Preferred Language: English; Race: Black or African American Current Smoking Status: Patient has never smoked.   Tobacco Use Assessment Completed: Used Tobacco in last 30 days? Does not drink caffeine.     Notes: 1 daughter   REVIEW OF SYSTEMS:    GU Review Female:   Patient reports get up at night to urinate. Patient denies frequent urination, hard to postpone urination, burning /pain with urination, leakage of urine, stream starts and stops, trouble starting your stream, have to strain to urinate, and being pregnant.  Gastrointestinal  (Upper):   Patient reports nausea. Patient denies vomiting and indigestion/ heartburn.  Gastrointestinal (Lower):   Patient denies diarrhea and constipation.  Constitutional:   Patient reports night sweats and fatigue. Patient denies fever and weight loss.  Skin:   Patient denies skin rash/ lesion and itching.  Eyes:   Patient denies blurred vision and double vision.  Ears/ Nose/ Throat:   Patient denies sore throat and sinus problems.  Hematologic/Lymphatic:   Patient denies swollen glands and easy bruising.  Cardiovascular:   Patient denies leg swelling and chest pains.  Respiratory:   Patient denies cough and shortness of breath.  Endocrine:   Patient denies excessive thirst.  Musculoskeletal:   Patient reports back pain. Patient denies joint pain.  Neurological:   Patient denies headaches and dizziness.  Psychologic:   Patient denies depression and anxiety.   VITAL SIGNS:      08/23/2018 11:21 AM  Weight 150 lb / 68.04 kg  Height 67 in / 170.18 cm  BP 137/87 mmHg  Pulse 86 /min  Temperature 98.5 F / 36.9 C  BMI 23.5 kg/m   MULTI-SYSTEM PHYSICAL EXAMINATION:    Constitutional: Well-nourished. No physical deformities. Normally developed. Good grooming.  Neck: Neck symmetrical, not swollen. Normal tracheal position.  Respiratory: Normal breath sounds. No labored breathing, no use of accessory muscles.   Cardiovascular: Regular rate and rhythm. No murmur, no gallop.   Lymphatic: No enlargement, no tenderness of supraclavicular or  neck lymph nodes.  Skin: No paleness, no jaundice, no cyanosis. No lesion, no ulcer, no rash.  Neurologic / Psychiatric: Oriented to time, oriented to place, oriented to person. No depression, no anxiety, no agitation.  Gastrointestinal: Abdominal tenderness, left flank. No mass, no rigidity, non obese abdomen.   Musculoskeletal: Normal gait and station of head and neck.     PAST DATA REVIEWED:  Source Of History:  Patient  Records Review:   Previous  Doctor Records, Previous Patient Records  Urine Test Review:   Urinalysis  X-Ray Review: C.T. Stone Protocol: Reviewed Films. Reviewed Report. Discussed With Patient. She has a 78m left distal stone and bilateral renal stones with a 925mright mid to lower pole stone.    Notes:                     Prior 24hr urine showed low urine volume in 2010 of about 0.9 - 1 liter.    PROCEDURES:         KUB - [id number] single view of the abdomen is obtained.               Urinalysis w/Scope Dipstick Dipstick Cont'd Micro  Color: Yellow Bilirubin: Neg mg/dL WBC/hpf: NS (Not Seen)  Appearance: Clear Ketones: Neg mg/dL RBC/hpf: 20 - 40/hpf  Specific Gravity: 1.025 Blood: 3+ ery/uL Bacteria: NS (Not Seen)  pH: <=5.0 Protein: Trace mg/dL Cystals: NS (Not Seen)  Glucose: Neg mg/dL Urobilinogen: 0.2 mg/dL Casts: NS (Not Seen)    Nitrites: Neg Trichomonas: Not Present    Leukocyte Esterase: Neg leu/uL Mucous: Present      Epithelial Cells: 0 - 5/hpf      Yeast: NS (Not Seen)      Sperm: Not Present    ASSESSMENT:      ICD-10 Details  1 GU:   Ureteral calculus - N20.1 Her stone has moved minimally. I discussed further MET, URS and ESWL and will proceed with URS later today. I have reviewed the risks of ureteroscopy including bleeding, infection, ureteral injury, need for a stent or secondary procedures, thrombotic events and anesthetic complications.   2   Renal calculus - N20.0 Worsening - She has bilateral renal stones and should have a repeat metabolic evaluation after recovery from the acute episode.    PLAN:           Orders X-Rays: KUB          Schedule Return Visit/Planned Activity: ASAP - Schedule Surgery          Document Letter(s):  Created for Patient: Clinical Summary         Notes:   CC: Scott Long PA and DaMargaretha SheffieldD.        Next Appointment:      Next Appointment: 09/08/2018 12:45 PM    Appointment Type: Postoperative Appointment    Location: [facility] - [phone]  Provider: BrAzucena Fallen  [facility]

## 2018-08-24 ENCOUNTER — Encounter (HOSPITAL_COMMUNITY): Payer: Self-pay | Admitting: Urology

## 2018-08-24 LAB — HEMOGLOBIN: Hemoglobin: 11.1 g/dL — ABNORMAL LOW (ref 12.0–15.0)

## 2018-08-24 NOTE — Op Note (Addendum)
Procedure: Cystoscopy with left ureteroscopic stone extraction with insertion of left double-J stent.  Preop diagnosis: Left distal ureteral stone.  Postop diagnosis: Same.  Surgeon: Dr. Irine Seal.  Resident physician: Dr. Andee Poles.  Anesthesia: General.  Drains: 6 French by 24 cm left contour double-J stent with tether.  Specimen: Stone.  EBL: None.  Complications: None.  Indications: Rose Wilson is a 52 year old African-American female who has a 4 mm left distal ureteral stone with pain.  She is elected to undergo ureteroscopy for management.  Procedure: She was given 2 g of Ancef.  She was taken operating room where general anesthetic was induced.  She was placed in lithotomy position and fitted with PAS hose.  Her perineum and genitalia were prepped with Betadine solution and she was draped in usual sterile fashion.  Cystoscopy was performed using the 23 [place] scope and 30 degree lens.  Examination revealed a normal urethra.  The bladder wall was smooth and pale without tumor stones or inflammation.  Ureteral orifices were unremarkable.  The left ureteral orifice was cannulated with a sensor guidewire that is been stiffen by a 6 [place] open end catheter.  The wire was easily passed by the stone to the kidney.  Paced feel blood was noted to effluxed from the ureteral orifice after passage.  A 15 French by 4 cm high-pressure balloon was passed over the wire and dilated to 20 atm of pressure across the ureteral meatus up to the level of the stone.  The cystoscope was removed and a 6.5 French semirigid ureteroscope was passed alongside the wire.  The stone was identified and grasped with an engage basket.  The stone was removed intact.  The cystoscope was reinserted over the wire and a 6 [place] by 24 cm contour double-J stent was passed to the kidney under fluoroscopic guidance.  Wire was removed leaving a good coil in the kidney and a good coil in the bladder.  The cystoscope  was removed leaving the stent string exiting the urethra.  The string was secured to the patient.  She was taken down from lithotomy position, her anesthetic was reversed and she was moved to recovery room in stable condition.  There were no complications.  She was given her stone to bring to the office.

## 2019-05-13 IMAGING — CT CT ABD-PELV W/O CM
2 of 4 series · 13 of 46 positions shown, 15 images · non-contrast
Comparison: 07/01/2014

CLINICAL DATA: Left flank pain for several days

EXAM:
CT ABDOMEN AND PELVIS WITHOUT CONTRAST
TECHNIQUE: Multidetector CT imaging of the abdomen and pelvis was performed
following the standard protocol without IV contrast.

[Series 2: renal stone 5.00 br40 s3 ax · axial · 0.58mm/px · z∈[+1370,+1755]mm · 10 of 93 slices shown, 12 images]
[im 8/93  soft-tissue]
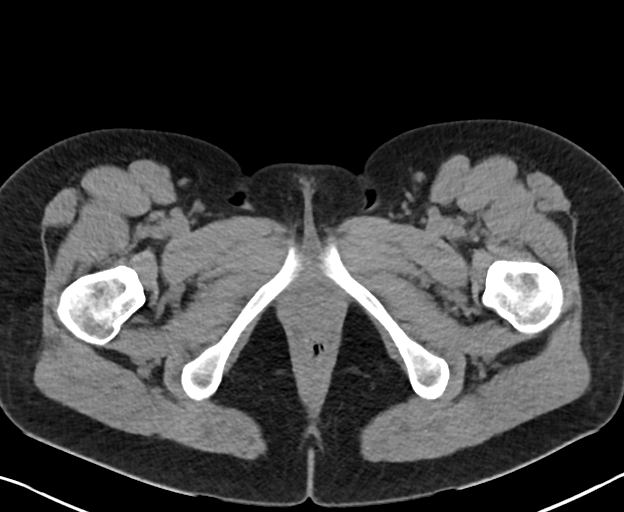
[im 8/93  bone]
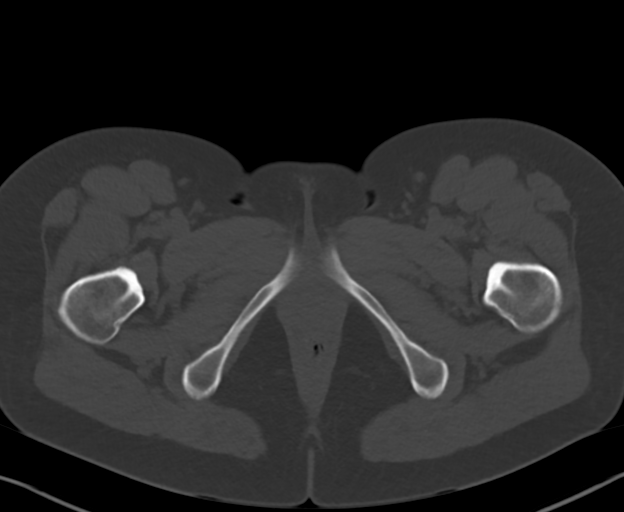
[im 16/93  soft-tissue]
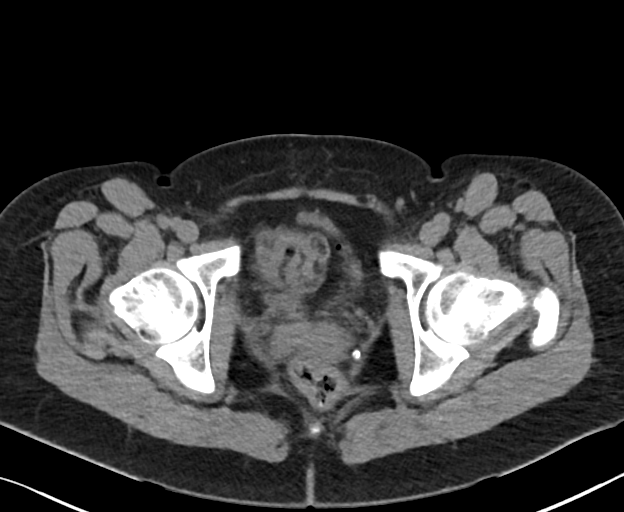
[im 24/93  soft-tissue]
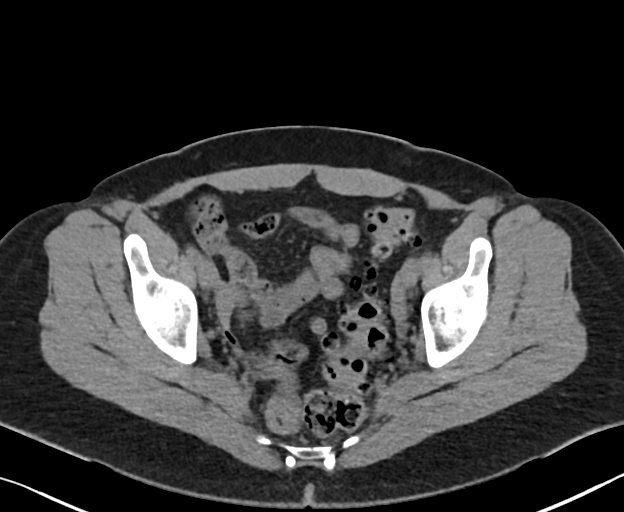
[im 35/93  soft-tissue]
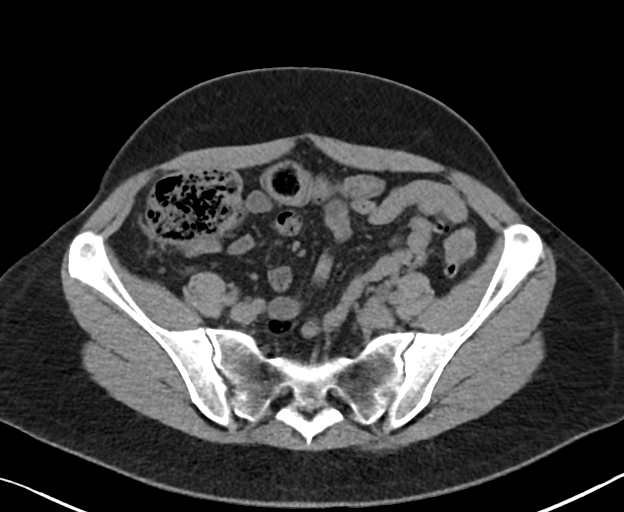
[im 43/93  soft-tissue]
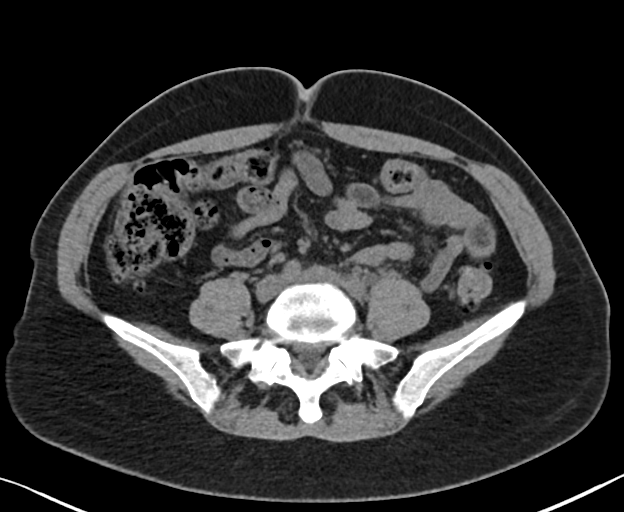
[im 50/93  soft-tissue]
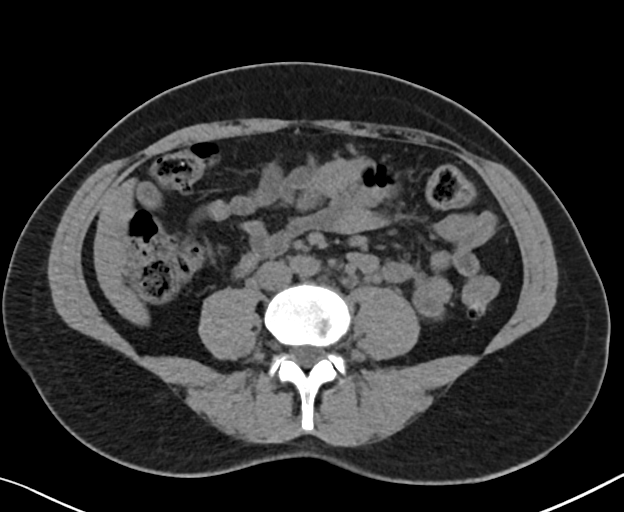
[im 58/93  soft-tissue]
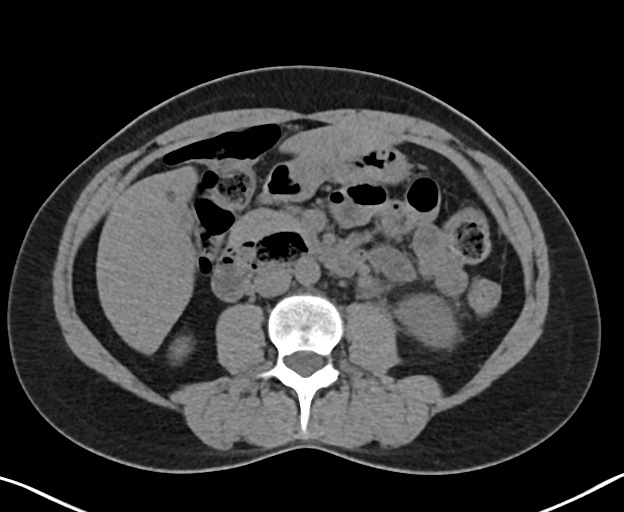
[im 70/93  soft-tissue]
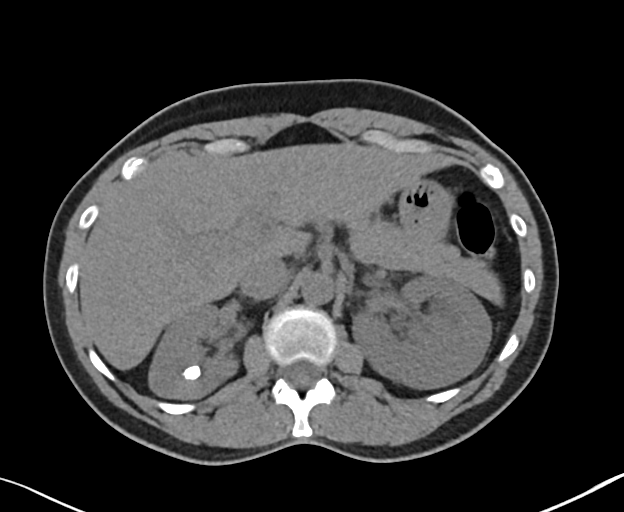
[im 77/93  soft-tissue]
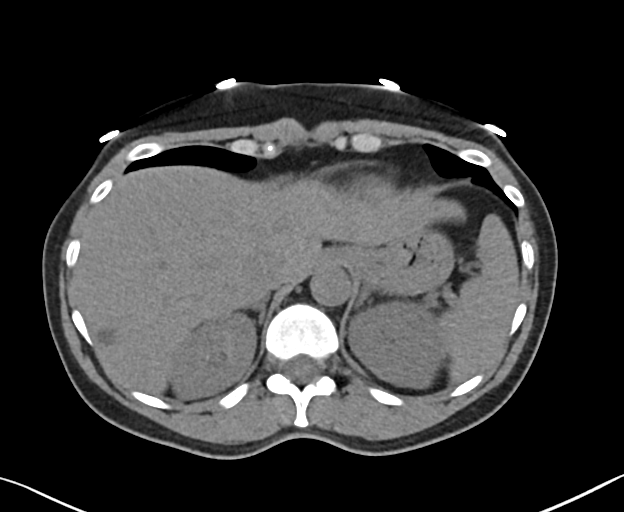
[im 77/93  bone]
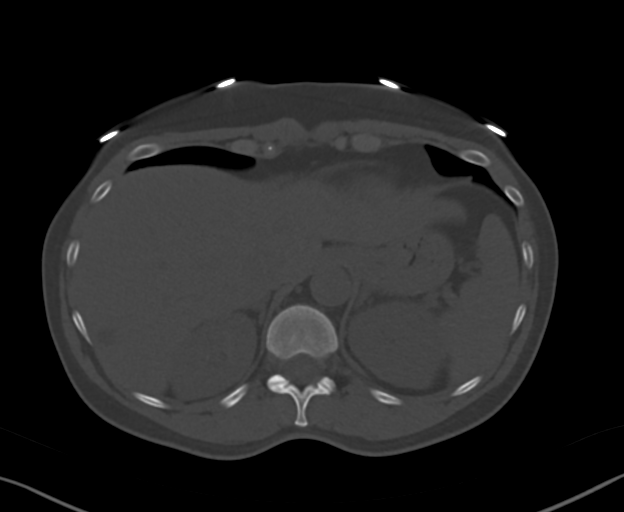
[im 85/93  soft-tissue]
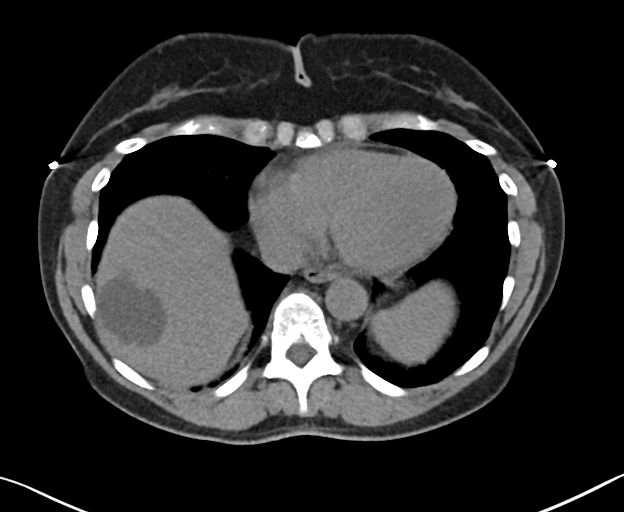

[Series 6: renal stone 2.00 br40 s3 cor · coronal · 0.71mm/px · 3 of 148 slices shown]
[im 50/148  soft-tissue]
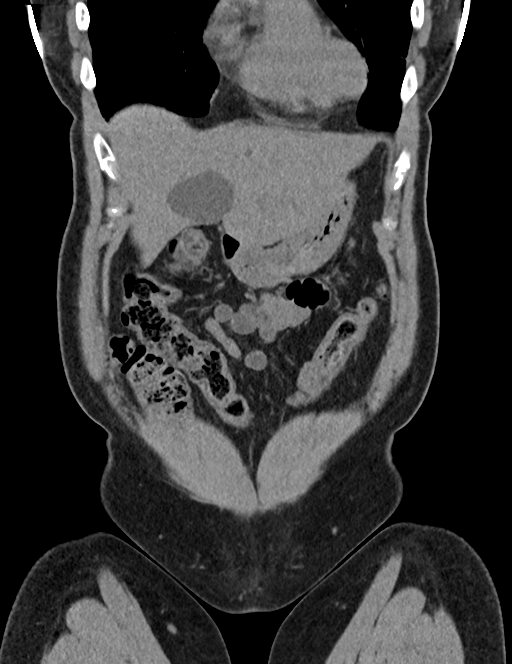
[im 66/148  soft-tissue]
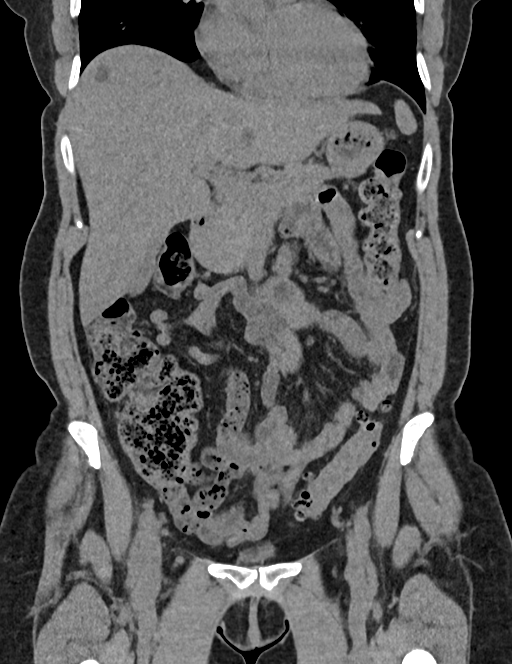
[im 82/148  soft-tissue]
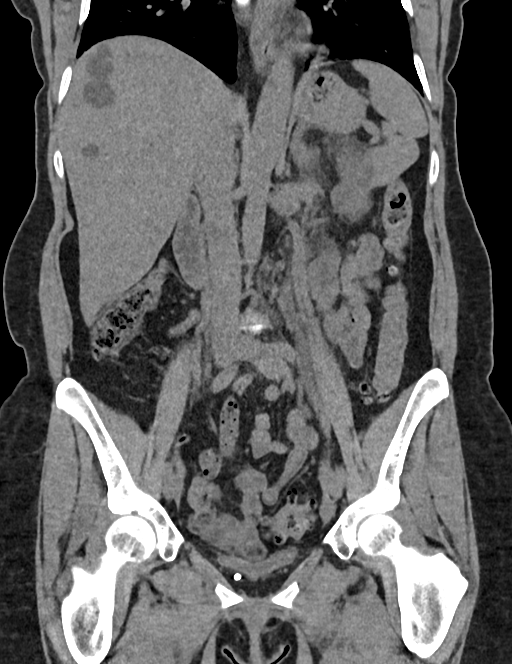

[13 of 46 positions shown; findings below may reference images not displayed]

FINDINGS: Lower chest: No acute abnormality.

Hepatobiliary: Scattered hypodensities are noted throughout the
liver consistent with cysts. They have increased in size when
compared with the prior exam. The largest of these on the left lies
in the medial segment of the left lobe of the liver measuring
cm. The largest of these in the right is near the dome measuring
cm. No focal mass lesion is identified. No biliary ductal dilatation
is seen. The gallbladder is predominately decompressed.

Pancreas: Unremarkable. No pancreatic ductal dilatation or
surrounding inflammatory changes.

Spleen: Normal in size without focal abnormality.

Adrenals/Urinary Tract: Adrenal glands are within normal limits. The
right kidney demonstrates multiple nonobstructing renal calculi. The
largest of these lies in the midportion of the kidney measuring 9 mm
in greatest dimension. This is increased from the prior exam at
which time it measured 7 mm. The right ureter is within normal
limits. The bladder is decompressed. The left kidney demonstrates
some hydronephrosis as well as mild perinephric stranding. Multiple
nonobstructing renal stones are seen slightly larger than that seen
on the prior exam. The largest of these measures 4 mm. The dilated
left ureter continues to the level of the distal ureter and which
point a 5 mm stone is identified best seen on image number 77 of
series 2.

Stomach/Bowel: Scattered diverticular changes noted without evidence
of diverticulitis. The appendix is within normal limits. No small
bowel abnormality is seen.

Vascular/Lymphatic: No significant vascular findings are present. No
enlarged abdominal or pelvic lymph nodes.

Reproductive: Status post hysterectomy. No adnexal masses.

Other: No abdominal wall hernia or abnormality. No abdominopelvic
ascites.

Musculoskeletal: No acute or significant osseous findings.
IMPRESSION: Multiple bilateral renal calculi are identified which have increased
in size when compared with the prior exam.

Increased left hydronephrosis and hydroureter with a distal left
ureteral stone identified measuring 5 mm.

Diverticulosis without diverticulitis.

Multiple hepatic cysts which have enlarged in size when compared
with the prior exam.

## 2019-06-15 ENCOUNTER — Encounter: Payer: Self-pay | Admitting: Gynecology

## 2019-09-06 DIAGNOSIS — Z8616 Personal history of COVID-19: Secondary | ICD-10-CM

## 2019-09-06 HISTORY — DX: Personal history of COVID-19: Z86.16

## 2021-02-06 ENCOUNTER — Other Ambulatory Visit: Payer: Self-pay

## 2021-02-06 ENCOUNTER — Other Ambulatory Visit: Payer: Self-pay | Admitting: Urology

## 2021-02-06 ENCOUNTER — Encounter (HOSPITAL_BASED_OUTPATIENT_CLINIC_OR_DEPARTMENT_OTHER): Payer: Self-pay | Admitting: Urology

## 2021-02-06 NOTE — Progress Notes (Signed)
Spoke w/ via phone for pre-op interview--- Pt Lab needs dos----  Istat and EKG             Lab results------ no COVID test ------ 02-08-2021 @ 1435 Arrive at ------- 0930 on 02-11-2021 NPO after MN NO Solid Food.  Clear liquids from MN until--- 0830 Med rec completed Medications to take morning of surgery ----- NONE Diabetic medication ----- n/a Patient instructed to bring photo id and insurance card day of surgery Patient aware to have Driver (ride ) / caregiver    for 24 hours after surgery -- husband, Francee Piccolo Patient Special Instructions ----- n/a Pre-Op special Istructions ----- n/a Patient verbalized understanding of instructions that were given at this phone interview. Patient denies shortness of breath, chest pain, fever, cough at this phone interview.

## 2021-02-07 ENCOUNTER — Other Ambulatory Visit (HOSPITAL_COMMUNITY): Payer: BLUE CROSS/BLUE SHIELD

## 2021-02-08 ENCOUNTER — Other Ambulatory Visit (HOSPITAL_COMMUNITY)
Admission: RE | Admit: 2021-02-08 | Discharge: 2021-02-08 | Disposition: A | Discharge status: Home / Self Care | Payer: BC Managed Care – PPO | Source: Ambulatory Visit | Attending: Urology | Admitting: Urology

## 2021-02-08 DIAGNOSIS — N202 Calculus of kidney with calculus of ureter: Secondary | ICD-10-CM | POA: Diagnosis present

## 2021-02-08 DIAGNOSIS — Z20822 Contact with and (suspected) exposure to covid-19: Secondary | ICD-10-CM | POA: Diagnosis not present

## 2021-02-08 DIAGNOSIS — Z01812 Encounter for preprocedural laboratory examination: Secondary | ICD-10-CM | POA: Insufficient documentation

## 2021-02-08 DIAGNOSIS — Z90710 Acquired absence of both cervix and uterus: Secondary | ICD-10-CM | POA: Diagnosis not present

## 2021-02-08 DIAGNOSIS — Z841 Family history of disorders of kidney and ureter: Secondary | ICD-10-CM | POA: Diagnosis not present

## 2021-02-08 LAB — SARS CORONAVIRUS 2 (TAT 6-24 HRS): SARS Coronavirus 2: NEGATIVE

## 2021-02-11 ENCOUNTER — Other Ambulatory Visit: Payer: Self-pay

## 2021-02-11 ENCOUNTER — Encounter (HOSPITAL_BASED_OUTPATIENT_CLINIC_OR_DEPARTMENT_OTHER): Payer: Self-pay | Admitting: Urology

## 2021-02-11 ENCOUNTER — Encounter (HOSPITAL_BASED_OUTPATIENT_CLINIC_OR_DEPARTMENT_OTHER)
Admission: RE | Disposition: A | Discharge status: Home / Self Care | Payer: Self-pay | Source: Ambulatory Visit | Attending: Urology

## 2021-02-11 ENCOUNTER — Ambulatory Visit (HOSPITAL_BASED_OUTPATIENT_CLINIC_OR_DEPARTMENT_OTHER): Payer: BC Managed Care – PPO | Admitting: Anesthesiology

## 2021-02-11 ENCOUNTER — Ambulatory Visit (HOSPITAL_BASED_OUTPATIENT_CLINIC_OR_DEPARTMENT_OTHER)
Admission: RE | Admit: 2021-02-11 | Discharge: 2021-02-11 | Disposition: A | Discharge status: Home / Self Care | Payer: BC Managed Care – PPO | Source: Ambulatory Visit | Attending: Urology | Admitting: Urology

## 2021-02-11 DIAGNOSIS — Z841 Family history of disorders of kidney and ureter: Secondary | ICD-10-CM | POA: Insufficient documentation

## 2021-02-11 DIAGNOSIS — Z20822 Contact with and (suspected) exposure to covid-19: Secondary | ICD-10-CM | POA: Diagnosis not present

## 2021-02-11 DIAGNOSIS — N202 Calculus of kidney with calculus of ureter: Secondary | ICD-10-CM | POA: Insufficient documentation

## 2021-02-11 DIAGNOSIS — Z90710 Acquired absence of both cervix and uterus: Secondary | ICD-10-CM | POA: Insufficient documentation

## 2021-02-11 HISTORY — DX: Calculus of ureter: N20.1

## 2021-02-11 HISTORY — PX: CYSTOSCOPY WITH RETROGRADE PYELOGRAM, URETEROSCOPY AND STENT PLACEMENT: SHX5789

## 2021-02-11 HISTORY — DX: Presence of spectacles and contact lenses: Z97.3

## 2021-02-11 HISTORY — DX: Essential (primary) hypertension: I10

## 2021-02-11 HISTORY — DX: Migraine, unspecified, not intractable, without status migrainosus: G43.909

## 2021-02-11 LAB — POCT I-STAT, CHEM 8
BUN: 29 mg/dL — ABNORMAL HIGH (ref 6–20)
Calcium, Ion: 1.35 mmol/L (ref 1.15–1.40)
Chloride: 104 mmol/L (ref 98–111)
Creatinine, Ser: 1.9 mg/dL — ABNORMAL HIGH (ref 0.44–1.00)
Glucose, Bld: 92 mg/dL (ref 70–99)
HCT: 40 % (ref 36.0–46.0)
Hemoglobin: 13.6 g/dL (ref 12.0–15.0)
Potassium: 4.8 mmol/L (ref 3.5–5.1)
Sodium: 138 mmol/L (ref 135–145)
TCO2: 24 mmol/L (ref 22–32)

## 2021-02-11 SURGERY — CYSTOURETEROSCOPY, WITH RETROGRADE PYELOGRAM AND STENT INSERTION
Anesthesia: General | Site: Ureter | Laterality: Right

## 2021-02-11 MED ORDER — LACTATED RINGERS IV SOLN: INTRAVENOUS | Status: DC

## 2021-02-11 MED ORDER — OXYCODONE HCL 5 MG PO TABS: 5.0000 mg | ORAL_TABLET | Freq: Once | ORAL | Status: DC | PRN

## 2021-02-11 MED ORDER — SCOPOLAMINE 1 MG/3DAYS TD PT72
1.0000 | MEDICATED_PATCH | TRANSDERMAL | Status: DC
  Administered 2021-02-11: 1.5 mg via TRANSDERMAL

## 2021-02-11 MED ORDER — SODIUM CHLORIDE 0.9 % IR SOLN
Status: DC | PRN
  Administered 2021-02-11: 3000 mL via INTRAVESICAL

## 2021-02-11 MED ORDER — ONDANSETRON HCL 4 MG/2ML IJ SOLN
INTRAMUSCULAR | Status: AC
  Filled 2021-02-11: qty 2

## 2021-02-11 MED ORDER — KETOROLAC TROMETHAMINE 30 MG/ML IJ SOLN
INTRAMUSCULAR | Status: AC
  Filled 2021-02-11: qty 1

## 2021-02-11 MED ORDER — DEXAMETHASONE SODIUM PHOSPHATE 10 MG/ML IJ SOLN
INTRAMUSCULAR | Status: DC | PRN
  Administered 2021-02-11: 8 mg via INTRAVENOUS

## 2021-02-11 MED ORDER — HYDROCODONE-ACETAMINOPHEN 5-325 MG PO TABS: 1.0000 | ORAL_TABLET | ORAL | 0 refills | Status: DC | PRN

## 2021-02-11 MED ORDER — ONDANSETRON HCL 4 MG/2ML IJ SOLN
INTRAMUSCULAR | Status: DC | PRN
  Administered 2021-02-11: 4 mg via INTRAVENOUS

## 2021-02-11 MED ORDER — OXYCODONE HCL 5 MG/5ML PO SOLN: 5.0000 mg | Freq: Once | ORAL | Status: DC | PRN

## 2021-02-11 MED ORDER — HYDROMORPHONE HCL 1 MG/ML IJ SOLN: 0.2500 mg | INTRAMUSCULAR | Status: DC | PRN

## 2021-02-11 MED ORDER — MIDAZOLAM HCL 2 MG/2ML IJ SOLN
INTRAMUSCULAR | Status: AC
  Filled 2021-02-11: qty 2

## 2021-02-11 MED ORDER — PHENYLEPHRINE 40 MCG/ML (10ML) SYRINGE FOR IV PUSH (FOR BLOOD PRESSURE SUPPORT)
PREFILLED_SYRINGE | INTRAVENOUS | Status: DC | PRN
  Administered 2021-02-11 (×2): 80 ug via INTRAVENOUS

## 2021-02-11 MED ORDER — FENTANYL CITRATE (PF) 100 MCG/2ML IJ SOLN
INTRAMUSCULAR | Status: DC | PRN
  Administered 2021-02-11: 50 ug via INTRAVENOUS

## 2021-02-11 MED ORDER — ONDANSETRON HCL 4 MG/2ML IJ SOLN: 4.0000 mg | Freq: Once | INTRAMUSCULAR | Status: DC | PRN

## 2021-02-11 MED ORDER — LIDOCAINE 2% (20 MG/ML) 5 ML SYRINGE
INTRAMUSCULAR | Status: AC
  Filled 2021-02-11: qty 5

## 2021-02-11 MED ORDER — MIDAZOLAM HCL 5 MG/5ML IJ SOLN
INTRAMUSCULAR | Status: DC | PRN
  Administered 2021-02-11: 2 mg via INTRAVENOUS

## 2021-02-11 MED ORDER — DEXAMETHASONE SODIUM PHOSPHATE 10 MG/ML IJ SOLN
INTRAMUSCULAR | Status: AC
  Filled 2021-02-11: qty 1

## 2021-02-11 MED ORDER — IOHEXOL 300 MG/ML  SOLN
INTRAMUSCULAR | Status: DC | PRN
  Administered 2021-02-11: 10 mL

## 2021-02-11 MED ORDER — CEFAZOLIN SODIUM-DEXTROSE 2-4 GM/100ML-% IV SOLN
INTRAVENOUS | Status: AC
  Filled 2021-02-11: qty 100

## 2021-02-11 MED ORDER — PROPOFOL 10 MG/ML IV BOLUS
INTRAVENOUS | Status: DC | PRN
  Administered 2021-02-11: 180 mg via INTRAVENOUS

## 2021-02-11 MED ORDER — PHENYLEPHRINE 40 MCG/ML (10ML) SYRINGE FOR IV PUSH (FOR BLOOD PRESSURE SUPPORT)
PREFILLED_SYRINGE | INTRAVENOUS | Status: AC
  Filled 2021-02-11: qty 10

## 2021-02-11 MED ORDER — KETOROLAC TROMETHAMINE 30 MG/ML IJ SOLN
INTRAMUSCULAR | Status: DC | PRN
  Administered 2021-02-11: 30 mg via INTRAVENOUS

## 2021-02-11 MED ORDER — FENTANYL CITRATE (PF) 100 MCG/2ML IJ SOLN
INTRAMUSCULAR | Status: AC
  Filled 2021-02-11: qty 2

## 2021-02-11 MED ORDER — LIDOCAINE 2% (20 MG/ML) 5 ML SYRINGE
INTRAMUSCULAR | Status: DC | PRN
  Administered 2021-02-11: 100 mg via INTRAVENOUS

## 2021-02-11 MED ORDER — CEFAZOLIN SODIUM-DEXTROSE 2-4 GM/100ML-% IV SOLN
2.0000 g | INTRAVENOUS | Status: AC
  Administered 2021-02-11: 2 g via INTRAVENOUS

## 2021-02-11 MED ORDER — SCOPOLAMINE 1 MG/3DAYS TD PT72
MEDICATED_PATCH | TRANSDERMAL | Status: AC
  Filled 2021-02-11: qty 1

## 2021-02-11 SURGICAL SUPPLY — 22 items
BAG DRAIN URO-CYSTO SKYTR STRL (DRAIN) ×2 IMPLANT
BAG DRN UROCATH (DRAIN) ×1
BASKET ZERO TIP NITINOL 2.4FR (BASKET) ×2 IMPLANT
BSKT STON RTRVL ZERO TP 2.4FR (BASKET) ×1
CATH INTERMIT  6FR 70CM (CATHETERS) ×2 IMPLANT
CLOTH BEACON ORANGE TIMEOUT ST (SAFETY) ×2 IMPLANT
FIBER LASER FLEXIVA 365 (UROLOGICAL SUPPLIES) IMPLANT
GLOVE SURG ENC MOIS LTX SZ7 (GLOVE) ×4 IMPLANT
GLOVE SURG ENC MOIS LTX SZ7.5 (GLOVE) ×2 IMPLANT
GOWN STRL REUS W/TWL LRG LVL3 (GOWN DISPOSABLE) ×2 IMPLANT
GOWN STRL REUS W/TWL XL LVL3 (GOWN DISPOSABLE) ×2 IMPLANT
GUIDEWIRE STR DUAL SENSOR (WIRE) ×4 IMPLANT
IV NS IRRIG 3000ML ARTHROMATIC (IV SOLUTION) ×2 IMPLANT
KIT TURNOVER CYSTO (KITS) ×2 IMPLANT
MANIFOLD NEPTUNE II (INSTRUMENTS) ×2 IMPLANT
NS IRRIG 500ML POUR BTL (IV SOLUTION) ×2 IMPLANT
PACK CYSTO (CUSTOM PROCEDURE TRAY) ×2 IMPLANT
SHEATH URET ACCESS 12FR/35CM (UROLOGICAL SUPPLIES) ×2 IMPLANT
TRACTIP FLEXIVA PULS ID 200XHI (Laser) ×1 IMPLANT
TRACTIP FLEXIVA PULSE ID 200 (Laser) ×2
TUBE CONNECTING 12X1/4 (SUCTIONS) IMPLANT
TUBING UROLOGY SET (TUBING) ×2 IMPLANT

## 2021-02-11 NOTE — Transfer of Care (Signed)
Immediate Anesthesia Transfer of Care Note  Patient: Rose Wilson  Procedure(s) Performed: CYSTOSCOPY WITH RIGHT RETROGRADE PYELOGRAM, URETEROSCOPY WITH HOLMIUM LASER AND STENT PLACEMENT (Right Ureter)  Patient Location: PACU  Anesthesia Type:General  Level of Consciousness: sedated  Airway & Oxygen Therapy: Patient Spontanous Breathing and Patient connected to nasal cannula oxygen  Post-op Assessment: Report given to RN  Post vital signs: Reviewed and stable  Last Vitals:  Vitals Value Taken Time  BP 121/73   Temp    Pulse 77 02/11/21 1345  Resp 16 02/11/21 1346  SpO2 100 % 02/11/21 1345  Vitals shown include unvalidated device data.  Last Pain:  Vitals:   02/11/21 0952  TempSrc: Oral  PainSc: 1       Patients Stated Pain Goal: 7 (02/11/21 8546)  Complications: No complications documented.

## 2021-02-11 NOTE — Discharge Instructions (Addendum)
[facility] °[phone] °Post Ureteroscopy With or Without Stent Instructions ° °Definitions: ° °Ureter: The duct that transports urine from the kidney to the bladder. °Stent:   A plastic hollow tube that is placed into the ureter, from the kidney to the                 bladder to prevent the ureter from swelling shut. ° °GENERAL INSTRUCTIONS: ° °Despite the fact that no skin incisions were used, the area around the ureter and bladder is raw and irritated. The stent is a foreign body which will further irritate the bladder wall. This irritation is manifested by increased frequency of urination, both day and night, and by an increase in the urge to urinate. In some, the urge to urinate is present almost always. Sometimes the urge is strong enough that you may not be able to stop yourself from urinating. The only real cure is to remove the stent and then give time for the bladder wall to heal which can't be done until the danger of the ureter swelling shut has passed, which varies. ° °You may see some blood in your urine while the stent is in place and a few days afterwards. Do not be alarmed, even if the urine was clear for a while. Get off your feet and drink lots of fluids until clearing occurs. If you start to pass clots or don't improve, call us. ° °DIET: °You may return to your normal diet immediately. Because of the raw surface of your bladder, alcohol, spicy foods, acid type foods and drinks with caffeine may cause irritation or frequency and should be used in moderation. To keep your urine flowing freely and to avoid constipation, drink plenty of fluids during the day ( 8-10 glasses ). °Tip: Avoid cranberry juice because it is very acidic. ° °ACTIVITY: °Your physical activity doesn't need to be restricted. However, if you are very active, you may see some blood in your urine. We suggest that you reduce your activity under these circumstances until the bleeding has stopped. ° °BOWELS: °It is  important to keep your bowels regular during the postoperative period. Straining with bowel movements can cause bleeding. A bowel movement every other day is reasonable. Use a mild laxative if needed, such as Milk of Magnesia 2-3 tablespoons, or 2 Dulcolax tablets. Call if you continue to have problems. If you have been taking narcotics for pain, before, during or after your surgery, you may be constipated. Take a laxative if necessary. ° ° °MEDICATION: °You should resume your pre-surgery medications unless told not to. °You may take oxybutynin or flomax if prescribed for bladder spasms or discomfort from the stent °Take pain medication as directed for pain refractory to conservative management ° °PROBLEMS YOU SHOULD REPORT TO US: °· Fevers over 100.5 Fahrenheit. °· Heavy bleeding, or clots ( See above notes about blood in urine ). °· Inability to urinate. °· Drug reactions ( hives, rash, nausea, vomiting, diarrhea ). °· Severe burning or pain with urination that is not improving. ° ° °Post Anesthesia Home Care Instructions ° °Activity: °Get plenty of rest for the remainder of the day. A responsible individual must stay with you for 24 hours following the procedure.  °For the next 24 hours, DO NOT: °-Drive a car °-Operate machinery °-Drink alcoholic beverages °-Take any medication unless instructed by your physician °-Make any legal decisions or sign important papers. ° °Meals: °Start with liquid foods such as gelatin or soup. Progress to regular foods   as tolerated. Avoid greasy, spicy, heavy foods. If nausea and/or vomiting occur, drink only clear liquids until the nausea and/or vomiting subsides. Call your physician if vomiting continues. ° °Special Instructions/Symptoms: °Your throat may feel dry or sore from the anesthesia or the breathing tube placed in your throat during surgery. If this causes discomfort, gargle with warm salt water. The discomfort should disappear within 24 hours. ° °If you had a scopolamine  patch placed behind your ear for the management of post- operative nausea and/or vomiting: ° °1. The medication in the patch is effective for 72 hours, after which it should be removed.  Wrap patch in a tissue and discard in the trash. Wash hands thoroughly with soap and water. °2. You may remove the patch earlier than 72 hours if you experience unpleasant side effects which may include dry mouth, dizziness or visual disturbances. °3. Avoid touching the patch. Wash your hands with soap and water after contact with the patch. °  ° ° °

## 2021-02-11 NOTE — Interval H&P Note (Signed)
History and Physical Interval Note:  02/11/2021 12:10 PM  Rose Wilson  has presented today for surgery, with the diagnosis of RIGHT URETERAL STONE.  The various methods of treatment have been discussed with the patient and family. After consideration of risks, benefits and other options for treatment, the patient has consented to  Procedure(s) with comments: CYSTOSCOPY WITH RIGHT RETROGRADE PYELOGRAM, URETEROSCOPY WITH HOLMIUM LASER AND STENT PLACEMENT (Right) - REQUESTING 75 MINS as a surgical intervention.  The patient's history has been reviewed, patient examined, no change in status, stable for surgery.  I have reviewed the patient's chart and labs.  Questions were answered to the patient's satisfaction.     Ray Church, III

## 2021-02-11 NOTE — H&P (Signed)
CC: I have ureteral stone.  HPI: Rose Wilson is a 55 year-old female established patient who is here for ureteral stone.  02/05/2021: 55 year old female who presents with intermittent and sharp right-sided flank pain and discomfort that began 2 weeks ago and has been associated with some lower back pain, urinary frequency, and suprapubic pressure. She has a history of stones requiring surgical intervention in the past. She denies fevers and chills. She denies gross hematuria. She has not seen a stone pass. She does associate the pain with some mild nausea.   09/08/18: She returns today for follow up. She is s/p left URS for distal ureteral calculus on 11/18. She was left with tethered stent, which she removed at home without difficulties. Today she states that she is doing well. She denies current flank or abdominal pain. She denies gross hematuria, dysuria, or exacerbation of lower urinary tract symptoms. She denies nausea, vomiting, fevers, or chills. She does have multiple non obstructing renal calculi based on most recent CT imaging. She states that she has passed 2 stones in the past. This is the first procedure she has ever needed.    08/23/18: Ms. Rose Wilson is a 55 yo female who is sent in consultation by Shanon Rosser PA. She had the onset of left flank pain that was severe on 08/16/18. She has a prior history of stones. A CT on 11/15 showed a 67m left distal stone. She is uncomfortable today with nausea. She has had no hematuria or voiding symptoms. She has had no fever.   The problem is on the right side. This is not her first kidney stone. She is currently having flank pain, back pain, and nausea. She denies having groin pain, vomiting, fever, and chills. Pain is occuring on the right side. She has not caught a stone in her urine strainer since her symptoms began.   She has had ureteral stent and ureteroscopy for treatment of her stones in the past.     ALLERGIES: No Allergies     MEDICATIONS: Atenolol     GU PSH: Cystoscopy Insert Stent, Left - 08/23/2018 Hysterectomy Unilat SO - 2009 Ureteroscopic stone removal, Left - 08/23/2018       PSH Notes: Hysterectomy   NON-GU PSH: No Non-GU PSH    GU PMH: Renal and ureteral calculus - 09/08/2018 Ureteral calculus, Her stone has moved minimally. I discussed further MET, URS and ESWL and will proceed with URS later today. I have reviewed the risks of ureteroscopy including bleeding, infection, ureteral injury, need for a stent or secondary procedures, thrombotic events and anesthetic complications. - 08/23/2018, Calculus of ureter, - 2014 Chronic cystitis (w/o hematuria), Chronic cystitis - 2014 Renal calculus, Nephrolithiasis - 2014      PMH Notes:  1898-10-06 00:00:00 - Note: Normal Routine History And Physical Adult   NON-GU PMH: No Non-GU PMH    FAMILY HISTORY: Diverticulitis Of Colon - Mother Family Health Status - Father alive at age 225- R45In Family Family Health Status - Mother's Age - Runs In Family Family Health Status Number - Runs In Family Hypertension - Father, Mother, Daughter nephrolithiasis - Mother Pure Hypercholesterolemia - Mother   SOCIAL HISTORY: Marital Status: Married Preferred Language: English; Race: Black or African American Current Smoking Status: Patient has never smoked.   Tobacco Use Assessment Completed: Used Tobacco in last 30 days? Does not drink caffeine.     Notes: 1 daughter   REVIEW OF SYSTEMS:    GU Review Female:  Patient reports frequent urination. Patient denies hard to postpone urination, burning /pain with urination, get up at night to urinate, leakage of urine, stream starts and stops, trouble starting your stream, have to strain to urinate, and being pregnant.  Gastrointestinal (Upper):   Patient reports nausea. Patient denies vomiting and indigestion/ heartburn.  Gastrointestinal (Lower):   Patient denies diarrhea and constipation.  Constitutional:    Patient denies fever, night sweats, weight loss, and fatigue.  Musculoskeletal:   Patient denies back pain and joint pain.  Neurological:   Patient denies headaches and dizziness.  Psychologic:   Patient denies depression and anxiety.   Notes: Right flank pain    VITAL SIGNS:      02/05/2021 01:38 PM  BP 128/83 mmHg  Pulse 85 /min  Temperature 97.77 F / 36.5 C   GU PHYSICAL EXAMINATION:      Notes: No CVA tenderness   MULTI-SYSTEM PHYSICAL EXAMINATION:    Constitutional: Well-nourished. No physical deformities. Normally developed. Good grooming.  Respiratory: No labored breathing, no use of accessory muscles.   Skin: No paleness, no jaundice, no cyanosis. No lesion, no ulcer, no rash.  Neurologic / Psychiatric: Oriented to time, oriented to place, oriented to person. No depression, no anxiety, no agitation.  Gastrointestinal: No mass, no tenderness, no rigidity, non obese abdomen.  Musculoskeletal: Normal gait and station of head and neck.     Complexity of Data:  Source Of History:  Patient, Medical Record Summary  Records Review:   Previous Doctor Records, Previous Patient Records  Urine Test Review:   Urinalysis, Urine Culture   02/05/21  Urinalysis  Urine Appearance Clear   Urine Color Yellow   Urine Glucose Neg mg/dL  Urine Bilirubin Neg mg/dL  Urine Ketones Neg mg/dL  Urine Specific Gravity 1.025   Urine Blood Neg ery/uL  Urine pH 5.5   Urine Protein Neg mg/dL  Urine Urobilinogen 0.2 mg/dL  Urine Nitrites Neg   Urine Leukocyte Esterase Trace leu/uL  Urine WBC/hpf 0 - 5/hpf   Urine RBC/hpf 0 - 2/hpf   Urine Epithelial Cells 0 - 5/hpf   Urine Bacteria Few (10-25/hpf)   Urine Mucous Not Present   Urine Yeast NS (Not Seen)   Urine Trichomonas Not Present   Urine Cystals NS (Not Seen)   Urine Casts NS (Not Seen)   Urine Sperm Not Present    PROCEDURES:         C.T. Urogram - [id number]      Patient confirmed No Neulasta OnPro Device.          Renal  Ultrasound (Limited) - [id number]  Kidney: Length: 12.3 cm Depth:5.7 cm Cortical Width1.1: cm Width: 5.0 cm    Right Kidney/Ureter:  There is moderate hydro w hydroureter as well noted. There is a lower pole calc measuring 5.4 mm and a ? of an upper pole calc at 5.3 mm  Bladder:  Bladder 78 ml      . Patient confirmed No Neulasta OnPro Device.            KUB - [id number]  A single view of the abdomen is obtained. Bilateral renal shadows are well visualized. Along the expected course of the right ureter there are 2 opacities noted 1 is behind the iliac crest and appears to be about 7-8 mm wide. There is a smaller opacity noted near the UPJ. There is a prominent overlying bowel gas pattern.      . Patient confirmed No Neulasta OnPro Device.  Urinalysis w/Scope - 81001 Dipstick Dipstick Cont'd Micro  Color: Yellow Bilirubin: Neg WBC/hpf: 0 - 5/hpf  Appearance: Clear Ketones: Neg RBC/hpf: 0 - 2/hpf  Specific Gravity: 1.025 Blood: Neg Bacteria: Few (10-25/hpf)  pH: 5.5 Protein: Neg Cystals: NS (Not Seen)  Glucose: Neg Urobilinogen: 0.2 Casts: NS (Not Seen)    Nitrites: Neg Trichomonas: Not Present    Leukocyte Esterase: Trace Mucous: Not Present      Epithelial Cells: 0 - 5/hpf      Yeast: NS (Not Seen)      Sperm: Not Present    Notes:      ASSESSMENT:      ICD-10 Details  1 GU:   Ureteral calculus - N20.1 Right, Acute, Systemic Symptoms  2   Renal calculus - N20.0 Right, Chronic, Exacerbation   PLAN:            Medications New Meds: Bactrim Ds 800 mg-160 mg tablet 1 tablet PO BID   #14  0 Refill(s)  Tamsulosin Hcl 0.4 mg capsule 1 capsule PO Q HS   #20  0 Refill(s)  Ondansetron Hcl 4 mg tablet 1 tablet PO Q 4 H PRN for nausea  #30  0 Refill(s)  Oxycodone-Acetaminophen 5 mg-325 mg tablet 1 tablet PO Q 6 H PRN for severe pain  #20  0 Refill(s)            Orders Labs CULTURE, URINE  X-Rays: C.T. Stone Protocol Without Contrast    Renal Ultrasound (Limited)    KUB   X-Ray Notes: History:  Hematuria: Yes/No  Patient to see MD after exam: Yes/No  Previous exam: CT / IVP/ US/ KUB/ None  When:  Where:  Diabetic: Yes/ No  BUN/ Creatine:  Date of last BUN Creatinine:  Weight in pounds:  Allergy- Contrasts/ Shellfish: Yes/ No  Conflicting diabetic meds: Yes/ No  Oral contrast and instructions given to patient:   Prior Authorization #: [id number] valid 02/05/21 thru 08/03/21            Schedule Return Visit/Planned Activity: Next Available Appointment - Schedule Surgery          Document Letter(s):  Created for Patient: Clinical Summary         Notes:   Urinalysis will be sent for precautionary culture today. Her KUB and renal ultrasound are concerning for an obstructing stone. However, due to bowel gas pattern it is difficult to discern if there are 2 obstructing stones or 1. I advised to pursue with a CT scan at this time. This showed right-sided hydroureter nephrosis with a mid to distal 9-10 mm obstructing stone. Awaiting final radiology read. We discussed options and she would like to pursue definitive stone intervention for this. She would like to undergo ureteroscopy. I will discuss this with her urologist in place the proper paperwork given his recommendation. In the interval, she was sent in pain and nausea medication as well as Bactrim for possible urinary tract infection. Advised strict return precautions for any worsening symptomatology including fevers, chills, worsening pain. She verbalized her understanding. She will need further follow-up after definitive stone management for stone prevention strategies.    Signed by Daine Gravel, NP on 02/07/21 at 8:21 AM (EDT

## 2021-02-11 NOTE — Anesthesia Preprocedure Evaluation (Addendum)
Anesthesia Evaluation  Patient identified by MRN, date of birth, ID band Patient awake    Reviewed: Allergy & Precautions, NPO status , Patient's Chart, lab work & pertinent test results, reviewed documented beta blocker date and time   History of Anesthesia Complications (+) Family history of anesthesia reaction  Airway Mallampati: II  TM Distance: >3 FB Neck ROM: Full    Dental no notable dental hx. (+) Teeth Intact, Caps, Dental Advisory Given   Pulmonary  Covid 19, 09/2019, mild symptoms, resolved   Pulmonary exam normal breath sounds clear to auscultation       Cardiovascular hypertension, Pt. on medications and Pt. on home beta blockers  Rhythm:Regular Rate:Normal     Neuro/Psych  Headaches, negative psych ROS   GI/Hepatic negative GI ROS, Neg liver ROS,   Endo/Other  negative endocrine ROS  Renal/GU Renal InsufficiencyRenal diseaseRight ureteral calculus Hx/o nephrolithiasis  negative genitourinary   Musculoskeletal negative musculoskeletal ROS (+)   Abdominal   Peds  Hematology  (+) anemia ,   Anesthesia Other Findings   Reproductive/Obstetrics                            Anesthesia Physical Anesthesia Plan  ASA: II  Anesthesia Plan: General   Post-op Pain Management:    Induction:   PONV Risk Score and Plan: 4 or greater and Treatment may vary due to age or medical condition, Scopolamine patch - Pre-op, Midazolam, Ondansetron and Dexamethasone  Airway Management Planned: LMA  Additional Equipment:   Intra-op Plan:   Post-operative Plan: Extubation in OR  Informed Consent: I have reviewed the patients History and Physical, chart, labs and discussed the procedure including the risks, benefits and alternatives for the proposed anesthesia with the patient or authorized representative who has indicated his/her understanding and acceptance.     Dental advisory  given  Plan Discussed with: CRNA and Anesthesiologist  Anesthesia Plan Comments:         Anesthesia Quick Evaluation

## 2021-02-11 NOTE — Op Note (Signed)
Operative Note  Preoperative diagnosis:  1.  Right ureteral calculus  Postoperative diagnosis: 1.  Right ureteral calculus  Procedure(s): 1.  Cystoscopy with right retrograde pyelogram, right ureteroscopy with laser lithotripsy and stone basketing and ureteral stent placement  Surgeon: Modena Slater, MD  Assistants: None  Anesthesia: General  Complications: None immediate  EBL: Minimal  Specimens: 1.  Renal calculus  Drains/Catheters: 1.  6 x 24 double-J ureteral stent  Intraoperative findings: 1.  Normal urethra and bladder 2.  Right retrograde pyelogram revealed no filling defect after removal of the stone.  Minimal hydronephrosis.  Ureteroscopy revealed a large 1 cm calculus and a smaller fragment behind it that was fragmented to smaller fragments and basket extracted.  All stones within the kidney were basket extracted as well.  Indication: 55 year old female with a right ureteral calculus presents for the previously mentioned operation.  Description of procedure:  The patient was identified and consent was obtained.  The patient was taken to the operating room and placed in the supine position.  The patient was placed under general anesthesia.  Perioperative antibiotics were administered.  The patient was placed in dorsal lithotomy.  Patient was prepped and draped in a standard sterile fashion and a timeout was performed.  A 21 French rigid cystoscope was advanced into the urethra and into the bladder.  Complete cystoscopy was performed with no abnormal findings.  The right ureter was cannulated with a sensor wire which was advanced up the kidney under fluoroscopic guidance.  A semirigid ureteroscope was advanced alongside the wire up to the stones of interest which were fragmented to smaller fragments.  Fragments were basket extracted and some other fragments were retropulsed into the kidney.  I advanced a second wire through the scope and into the kidney and withdrew the  scope.  One of the wires was secured to the drape as a safety wire and the other wire was used to advance a 12 x 14 access sheath over the wire under continuous fluoroscopic guidance.  Digital ureteroscopy was performed and many stone fragments were basket extracted.  Once the entire kidney and ureter were clear, I shot a retrograde pyelogram with findings noted above.  I then withdrew the scope along with the access sheath.  A couple more ureteral stones were basket extracted but no other ureteral stone fragments remained after removal of the scope that were clinically significant.  There was no obvious ureteral injury.  I backloaded the wire onto the rigid cystoscope and advanced that into the bladder followed by routine placement of a 6 x 24 double-J ureteral stent.  Fluoroscopy confirmed proximal placement and direct visualization confirmed a good coil within the bladder.  I drained the bladder and withdrew the scope.  Patient tolerated procedure well and was stable postoperative.  Plan: Follow-up in 1 week for stent removal.

## 2021-02-11 NOTE — Anesthesia Procedure Notes (Signed)
Procedure Name: LMA Insertion Date/Time: 02/11/2021 12:27 PM Performed by: Briant Sites, CRNA Pre-anesthesia Checklist: Patient identified, Emergency Drugs available, Suction available and Patient being monitored Patient Re-evaluated:Patient Re-evaluated prior to induction Oxygen Delivery Method: Circle system utilized Preoxygenation: Pre-oxygenation with 100% oxygen Induction Type: IV induction Ventilation: Mask ventilation without difficulty LMA: LMA inserted LMA Size: 4.0 Number of attempts: 1 Airway Equipment and Method: Bite block Placement Confirmation: positive ETCO2 Tube secured with: Tape Dental Injury: Teeth and Oropharynx as per pre-operative assessment

## 2021-02-12 ENCOUNTER — Encounter (HOSPITAL_BASED_OUTPATIENT_CLINIC_OR_DEPARTMENT_OTHER): Payer: Self-pay | Admitting: Urology

## 2021-02-12 NOTE — Anesthesia Postprocedure Evaluation (Signed)
Anesthesia Post Note  Patient: Zyara Jones-Cross  Procedure(s) Performed: CYSTOSCOPY WITH RIGHT RETROGRADE PYELOGRAM, URETEROSCOPY WITH HOLMIUM LASER AND STENT PLACEMENT (Right Ureter)     Patient location during evaluation: PACU Anesthesia Type: General Level of consciousness: awake and alert Pain management: pain level controlled Vital Signs Assessment: post-procedure vital signs reviewed and stable Respiratory status: spontaneous breathing, nonlabored ventilation, respiratory function stable and patient connected to nasal cannula oxygen Cardiovascular status: blood pressure returned to baseline and stable Postop Assessment: no apparent nausea or vomiting Anesthetic complications: no   No complications documented.              Shelton Silvas

## 2021-10-21 ENCOUNTER — Ambulatory Visit: Payer: BC Managed Care – PPO | Admitting: Cardiology

## 2021-12-30 DIAGNOSIS — E785 Hyperlipidemia, unspecified: Secondary | ICD-10-CM | POA: Diagnosis not present

## 2021-12-30 DIAGNOSIS — Z131 Encounter for screening for diabetes mellitus: Secondary | ICD-10-CM | POA: Diagnosis not present

## 2021-12-30 DIAGNOSIS — Z0000 Encounter for general adult medical examination without abnormal findings: Secondary | ICD-10-CM | POA: Diagnosis not present

## 2021-12-30 DIAGNOSIS — Z13228 Encounter for screening for other metabolic disorders: Secondary | ICD-10-CM | POA: Diagnosis not present

## 2021-12-30 DIAGNOSIS — I10 Essential (primary) hypertension: Secondary | ICD-10-CM | POA: Diagnosis not present

## 2022-01-02 ENCOUNTER — Telehealth: Payer: Self-pay

## 2022-01-02 NOTE — Telephone Encounter (Signed)
Notes scanned to referral 

## 2022-01-07 ENCOUNTER — Ambulatory Visit: Payer: BC Managed Care – PPO | Admitting: Internal Medicine

## 2022-01-07 VITALS — BP 112/82 | HR 69 | Ht 67.0 in | Wt 163.6 lb

## 2022-01-07 DIAGNOSIS — R011 Cardiac murmur, unspecified: Secondary | ICD-10-CM

## 2022-01-07 NOTE — Progress Notes (Signed)
?Cardiology Office Note:   ? ?Date:  01/07/2022  ? ?IDLaurenda Wilson, DOB [birth year], MRN [id number] ? ?PCP:  Pomposini, Cherly Anderson, MD ?  ?[facility] Providers ?Cardiologist:  None    ? ?Referring MD: Shanon Rosser, PA-C  ? ?No chief complaint on file. ?Murmur ? ?History of Present Illness:   ? ?Rose Wilson is a 56 y.o. female with a hx of migraines,HTN, referral from primary NP [place] for murmur ? ?[name] comes in today due to a concern for a heart murmur. She has no cardiac dx history. She typically goes to the gym M-F. She denies chest pain or SOB.  She has no LE edema. She denies orthopnea, PND or LE edema. ? ?She has not seen a cardiologist.. No cardiac stress test or LHC. She is on crestor 10 mg daily that started this week. ? ?Family Hx:  Mother had a heart murmur. No surgery. Father is 34 years old, heart dx.  ? ?Social Hx: No smoking hx.  ? ? ?Past Medical History:  ?Diagnosis Date  ? Family history of adverse reaction to anesthesia   ? mother difficult to wake up  ? History of 2019 novel coronavirus disease (COVID-19) 09/2019  ? per pt positive, mild symptoms resolved  ? History of kidney stones   ? Labile essential hypertension   ? followed by pcp  ? Migraines   ? Right ureteral stone   ? Wears glasses   ? ? ?Past Surgical History:  ?Procedure Laterality Date  ? ABDOMINAL HYSTERECTOMY  2004  ? [place]  ? CYSTOSCOPY W/ URETERAL STENT PLACEMENT Left 08/23/2018  ? Procedure: CYSTOSCOPY, URETEROSCOPY WITH STONE EXCAVATION AND LEFT URETERAL STENT PLACEMENT;  Surgeon: Irine Seal, MD;  Location: [facility];  Service: Urology;  Laterality: Left;  ? CYSTOSCOPY WITH RETROGRADE PYELOGRAM, URETEROSCOPY AND STENT PLACEMENT Right 02/11/2021  ? Procedure: CYSTOSCOPY WITH RIGHT RETROGRADE PYELOGRAM, URETEROSCOPY WITH HOLMIUM LASER AND STENT PLACEMENT;  Surgeon: Lucas Mallow, MD;  Location: [facility];  Service: Urology;  Laterality: Right;  REQUESTING 75 MINS  ?  TONSILLECTOMY  child  ? ? ?Current Medications: ?Current Outpatient Medications on File Prior to Visit  ?Medication Sig Dispense Refill  ? atenolol (TENORMIN) 25 MG tablet Take 25 mg by mouth at bedtime.    ? Multiple Vitamins-Minerals (MULTIVITAMIN ADULT) TABS Take 1 tablet by mouth daily.    ? rosuvastatin (CRESTOR) 10 MG tablet Take 10 mg by mouth daily.    ? ?No current facility-administered medications on file prior to visit.  ? ? ? ?Allergies:   Patient has no known allergies.  ? ?Social History  ? ?Socioeconomic History  ? Marital status: Married  ?  Spouse name: Not on file  ? Number of children: Not on file  ? Years of education: Not on file  ? Highest education level: Not on file  ?Occupational History  ? Not on file  ?Tobacco Use  ? Smoking status: Never  ? Smokeless tobacco: Never  ?Vaping Use  ? Vaping Use: Never used  ?Substance and Sexual Activity  ? Alcohol use: Yes  ?  Comment: occaionally  ? Drug use: Never  ? Sexual activity: Not on file  ?Other Topics Concern  ? Not on file  ?Social History Narrative  ? Not on file  ? ?Social Determinants of Health  ? ?Financial Resource Strain: Not on file  ?Food Insecurity: Not on file  ?Transportation Needs: Not on file  ?  Physical Activity: Not on file  ?Stress: Not on file  ?Social Connections: Not on file  ?  ? ?Family History: ?The patient's family history includes Cancer in her mother; High Cholesterol in her mother; Hypertension in her father and mother. ? ?ROS:   ?Please see the history of present illness.    ? All other systems reviewed and are negative. ? ?EKGs/Labs/Other Studies Reviewed:   ? ?The following studies were reviewed today: ? ? ?EKG:  EKG is  ordered today.  The ekg ordered today demonstrates  ? ?EKG 01/07/2022- NSR ? ?Recent Labs: ?02/11/2021: BUN 29; Creatinine, Ser 1.90; Hemoglobin 13.6; Potassium 4.8; Sodium 138  ?Recent Lipid Panel ?No results found for: CHOL, TRIG, HDL, CHOLHDL, VLDL, LDLCALC, LDLDIRECT ? ? ?Risk Assessment/Calculations:    ? The 10-year ASCVD risk score (Arnett DK, et al., 2019) is: 4.7% ?  Values used to calculate the score: ?    Age: 71 years ?    Sex: Female ?    Is Non-Hispanic African American: Yes ?    Diabetic: No ?    Tobacco smoker: No ?    Systolic Blood Pressure: XX123456 mmHg ?    Is BP treated: Yes ?    HDL Cholesterol: 42 mg/dL ?    Total Cholesterol: 207 mg/dL ? ? ?    ? ?Physical Exam:   ? ?VS:   ? ?Vitals:  ? 01/07/22 1500  ?BP: 112/82  ?Pulse: 69  ?SpO2: 96%  ? ? ?Wt Readings from Last 3 Encounters:  ?02/11/21 163 lb 12.8 oz (74.3 kg)  ?08/23/18 152 lb 3.2 oz (69 kg)  ?03/07/15 140 lb (63.5 kg)  ?  ? ?GEN:  Well nourished, well developed in no acute distress ?HEENT: Normal ?NECK: No JVD; No carotid bruits ?LYMPHATICS: No lymphadenopathy ?CARDIAC: RRR, no murmurs, rubs, gallops ?RESPIRATORY:  Clear to auscultation without rales, wheezing or rhonchi  ?ABDOMEN: Soft, non-tender, non-distended ?MUSCULOSKELETAL:  No edema; No deformity  ?SKIN: Warm and dry ?NEUROLOGIC:  Alert and oriented x 3 ?PSYCHIATRIC:  Normal affect  ? ?ASSESSMENT:   ? ?#Murmur: She did not have a murmur on exam. She is asymptomatic. There's no indication for further work up. ? ?#HTN: well controlled on atenolol. ? ?ASCVD/statin use: Her ASCVD is low. Typically wouldn't start statin at this time. Can consider CAC score if risk  5 to 7.5%. Discussed can re-assess risk in the future ? ? ?PLAN:   ? ?In order of problems listed above: ? ?Stop crestor ?Follow up as needed ? ?   ? ?  ?Medication Adjustments/Labs and Tests Ordered: ?Current medicines are reviewed at length with the patient today.  Concerns regarding medicines are outlined above.  ?No orders of the defined types were placed in this encounter. ? ?No orders of the defined types were placed in this encounter. ? ? ?There are no Patient Instructions on file for this visit.  ? ?Signed, ?Janina Mayo, MD  ?01/07/2022 2:57 PM    ?[place] ?

## 2022-01-07 NOTE — Patient Instructions (Signed)
Medication Instructions:  ?STOP CRESTOR ?*If you need a refill on your cardiac medications before your next appointment, please call your pharmacy* ? ?Follow-Up: ?At [facility], you and your health needs are our priority.  As part of our continuing mission to provide you with exceptional heart care, we have created designated Provider Care Teams.  These Care Teams include your primary Cardiologist (physician) and Advanced Practice Providers (APPs -  Physician Assistants and Nurse Practitioners) who all work together to provide you with the care you need, when you need it. ? ?Your next appointment:   ?AS NEEDED  ? ?The format for your next appointment:   ?In Person ? ?Provider:   ?Maisie Fus, MD   ?

## 2022-03-27 DIAGNOSIS — R1031 Right lower quadrant pain: Secondary | ICD-10-CM | POA: Diagnosis not present

## 2022-03-27 DIAGNOSIS — Z1211 Encounter for screening for malignant neoplasm of colon: Secondary | ICD-10-CM | POA: Diagnosis not present

## 2022-03-27 DIAGNOSIS — I10 Essential (primary) hypertension: Secondary | ICD-10-CM | POA: Diagnosis not present

## 2022-03-27 DIAGNOSIS — R7303 Prediabetes: Secondary | ICD-10-CM | POA: Diagnosis not present

## 2022-03-27 DIAGNOSIS — E785 Hyperlipidemia, unspecified: Secondary | ICD-10-CM | POA: Diagnosis not present

## 2022-03-27 DIAGNOSIS — F419 Anxiety disorder, unspecified: Secondary | ICD-10-CM | POA: Diagnosis not present

## 2022-05-13 DIAGNOSIS — Z1231 Encounter for screening mammogram for malignant neoplasm of breast: Secondary | ICD-10-CM | POA: Diagnosis not present

## 2022-05-13 DIAGNOSIS — Z1212 Encounter for screening for malignant neoplasm of rectum: Secondary | ICD-10-CM | POA: Diagnosis not present

## 2022-05-13 DIAGNOSIS — Z01419 Encounter for gynecological examination (general) (routine) without abnormal findings: Secondary | ICD-10-CM | POA: Diagnosis not present

## 2022-05-23 DIAGNOSIS — E1165 Type 2 diabetes mellitus with hyperglycemia: Secondary | ICD-10-CM | POA: Diagnosis not present

## 2022-08-21 DIAGNOSIS — E785 Hyperlipidemia, unspecified: Secondary | ICD-10-CM | POA: Diagnosis not present

## 2022-08-21 DIAGNOSIS — E1165 Type 2 diabetes mellitus with hyperglycemia: Secondary | ICD-10-CM | POA: Diagnosis not present

## 2022-11-10 DIAGNOSIS — M545 Low back pain, unspecified: Secondary | ICD-10-CM | POA: Diagnosis not present

## 2022-11-10 DIAGNOSIS — Z111 Encounter for screening for respiratory tuberculosis: Secondary | ICD-10-CM | POA: Diagnosis not present

## 2022-11-12 DIAGNOSIS — M5386 Other specified dorsopathies, lumbar region: Secondary | ICD-10-CM | POA: Diagnosis not present

## 2022-11-12 DIAGNOSIS — M9902 Segmental and somatic dysfunction of thoracic region: Secondary | ICD-10-CM | POA: Diagnosis not present

## 2022-11-12 DIAGNOSIS — M9904 Segmental and somatic dysfunction of sacral region: Secondary | ICD-10-CM | POA: Diagnosis not present

## 2022-11-12 DIAGNOSIS — M9903 Segmental and somatic dysfunction of lumbar region: Secondary | ICD-10-CM | POA: Diagnosis not present

## 2022-11-13 DIAGNOSIS — M9903 Segmental and somatic dysfunction of lumbar region: Secondary | ICD-10-CM | POA: Diagnosis not present

## 2022-11-13 DIAGNOSIS — M9904 Segmental and somatic dysfunction of sacral region: Secondary | ICD-10-CM | POA: Diagnosis not present

## 2022-11-13 DIAGNOSIS — M9902 Segmental and somatic dysfunction of thoracic region: Secondary | ICD-10-CM | POA: Diagnosis not present

## 2022-11-13 DIAGNOSIS — M5386 Other specified dorsopathies, lumbar region: Secondary | ICD-10-CM | POA: Diagnosis not present

## 2022-11-14 DIAGNOSIS — M9903 Segmental and somatic dysfunction of lumbar region: Secondary | ICD-10-CM | POA: Diagnosis not present

## 2022-11-14 DIAGNOSIS — M5386 Other specified dorsopathies, lumbar region: Secondary | ICD-10-CM | POA: Diagnosis not present

## 2022-11-14 DIAGNOSIS — M9904 Segmental and somatic dysfunction of sacral region: Secondary | ICD-10-CM | POA: Diagnosis not present

## 2022-11-14 DIAGNOSIS — M9902 Segmental and somatic dysfunction of thoracic region: Secondary | ICD-10-CM | POA: Diagnosis not present

## 2022-11-18 DIAGNOSIS — M5386 Other specified dorsopathies, lumbar region: Secondary | ICD-10-CM | POA: Diagnosis not present

## 2022-11-18 DIAGNOSIS — M9902 Segmental and somatic dysfunction of thoracic region: Secondary | ICD-10-CM | POA: Diagnosis not present

## 2022-11-18 DIAGNOSIS — M9903 Segmental and somatic dysfunction of lumbar region: Secondary | ICD-10-CM | POA: Diagnosis not present

## 2022-11-18 DIAGNOSIS — M9904 Segmental and somatic dysfunction of sacral region: Secondary | ICD-10-CM | POA: Diagnosis not present

## 2022-11-19 DIAGNOSIS — M9904 Segmental and somatic dysfunction of sacral region: Secondary | ICD-10-CM | POA: Diagnosis not present

## 2022-11-19 DIAGNOSIS — M5386 Other specified dorsopathies, lumbar region: Secondary | ICD-10-CM | POA: Diagnosis not present

## 2022-11-19 DIAGNOSIS — M9903 Segmental and somatic dysfunction of lumbar region: Secondary | ICD-10-CM | POA: Diagnosis not present

## 2022-11-19 DIAGNOSIS — M9902 Segmental and somatic dysfunction of thoracic region: Secondary | ICD-10-CM | POA: Diagnosis not present

## 2022-11-21 DIAGNOSIS — M5386 Other specified dorsopathies, lumbar region: Secondary | ICD-10-CM | POA: Diagnosis not present

## 2022-11-21 DIAGNOSIS — M9903 Segmental and somatic dysfunction of lumbar region: Secondary | ICD-10-CM | POA: Diagnosis not present

## 2022-11-21 DIAGNOSIS — M9902 Segmental and somatic dysfunction of thoracic region: Secondary | ICD-10-CM | POA: Diagnosis not present

## 2022-11-21 DIAGNOSIS — M9904 Segmental and somatic dysfunction of sacral region: Secondary | ICD-10-CM | POA: Diagnosis not present

## 2022-11-25 DIAGNOSIS — M9904 Segmental and somatic dysfunction of sacral region: Secondary | ICD-10-CM | POA: Diagnosis not present

## 2022-11-25 DIAGNOSIS — M9903 Segmental and somatic dysfunction of lumbar region: Secondary | ICD-10-CM | POA: Diagnosis not present

## 2022-11-25 DIAGNOSIS — M5386 Other specified dorsopathies, lumbar region: Secondary | ICD-10-CM | POA: Diagnosis not present

## 2022-11-25 DIAGNOSIS — M9902 Segmental and somatic dysfunction of thoracic region: Secondary | ICD-10-CM | POA: Diagnosis not present

## 2022-11-28 DIAGNOSIS — M9904 Segmental and somatic dysfunction of sacral region: Secondary | ICD-10-CM | POA: Diagnosis not present

## 2022-11-28 DIAGNOSIS — M9902 Segmental and somatic dysfunction of thoracic region: Secondary | ICD-10-CM | POA: Diagnosis not present

## 2022-11-28 DIAGNOSIS — M5386 Other specified dorsopathies, lumbar region: Secondary | ICD-10-CM | POA: Diagnosis not present

## 2022-11-28 DIAGNOSIS — M9903 Segmental and somatic dysfunction of lumbar region: Secondary | ICD-10-CM | POA: Diagnosis not present

## 2022-12-03 DIAGNOSIS — M9907 Segmental and somatic dysfunction of upper extremity: Secondary | ICD-10-CM | POA: Diagnosis not present

## 2022-12-03 DIAGNOSIS — M9902 Segmental and somatic dysfunction of thoracic region: Secondary | ICD-10-CM | POA: Diagnosis not present

## 2022-12-03 DIAGNOSIS — M5386 Other specified dorsopathies, lumbar region: Secondary | ICD-10-CM | POA: Diagnosis not present

## 2022-12-03 DIAGNOSIS — M9903 Segmental and somatic dysfunction of lumbar region: Secondary | ICD-10-CM | POA: Diagnosis not present

## 2022-12-03 DIAGNOSIS — M9904 Segmental and somatic dysfunction of sacral region: Secondary | ICD-10-CM | POA: Diagnosis not present

## 2022-12-10 DIAGNOSIS — M9902 Segmental and somatic dysfunction of thoracic region: Secondary | ICD-10-CM | POA: Diagnosis not present

## 2022-12-10 DIAGNOSIS — M5386 Other specified dorsopathies, lumbar region: Secondary | ICD-10-CM | POA: Diagnosis not present

## 2022-12-10 DIAGNOSIS — M9903 Segmental and somatic dysfunction of lumbar region: Secondary | ICD-10-CM | POA: Diagnosis not present

## 2022-12-10 DIAGNOSIS — M9904 Segmental and somatic dysfunction of sacral region: Secondary | ICD-10-CM | POA: Diagnosis not present

## 2022-12-12 DIAGNOSIS — M9904 Segmental and somatic dysfunction of sacral region: Secondary | ICD-10-CM | POA: Diagnosis not present

## 2022-12-12 DIAGNOSIS — M9903 Segmental and somatic dysfunction of lumbar region: Secondary | ICD-10-CM | POA: Diagnosis not present

## 2022-12-12 DIAGNOSIS — M9902 Segmental and somatic dysfunction of thoracic region: Secondary | ICD-10-CM | POA: Diagnosis not present

## 2022-12-12 DIAGNOSIS — M5386 Other specified dorsopathies, lumbar region: Secondary | ICD-10-CM | POA: Diagnosis not present

## 2022-12-17 DIAGNOSIS — M9902 Segmental and somatic dysfunction of thoracic region: Secondary | ICD-10-CM | POA: Diagnosis not present

## 2022-12-17 DIAGNOSIS — M9903 Segmental and somatic dysfunction of lumbar region: Secondary | ICD-10-CM | POA: Diagnosis not present

## 2022-12-17 DIAGNOSIS — M9904 Segmental and somatic dysfunction of sacral region: Secondary | ICD-10-CM | POA: Diagnosis not present

## 2022-12-17 DIAGNOSIS — M5386 Other specified dorsopathies, lumbar region: Secondary | ICD-10-CM | POA: Diagnosis not present

## 2022-12-24 DIAGNOSIS — M9903 Segmental and somatic dysfunction of lumbar region: Secondary | ICD-10-CM | POA: Diagnosis not present

## 2022-12-24 DIAGNOSIS — M5386 Other specified dorsopathies, lumbar region: Secondary | ICD-10-CM | POA: Diagnosis not present

## 2022-12-24 DIAGNOSIS — M9907 Segmental and somatic dysfunction of upper extremity: Secondary | ICD-10-CM | POA: Diagnosis not present

## 2022-12-24 DIAGNOSIS — M9902 Segmental and somatic dysfunction of thoracic region: Secondary | ICD-10-CM | POA: Diagnosis not present

## 2022-12-24 DIAGNOSIS — M9904 Segmental and somatic dysfunction of sacral region: Secondary | ICD-10-CM | POA: Diagnosis not present

## 2023-01-28 DIAGNOSIS — M7502 Adhesive capsulitis of left shoulder: Secondary | ICD-10-CM | POA: Diagnosis not present

## 2023-02-04 DIAGNOSIS — M9902 Segmental and somatic dysfunction of thoracic region: Secondary | ICD-10-CM | POA: Diagnosis not present

## 2023-02-04 DIAGNOSIS — M9904 Segmental and somatic dysfunction of sacral region: Secondary | ICD-10-CM | POA: Diagnosis not present

## 2023-02-04 DIAGNOSIS — M5386 Other specified dorsopathies, lumbar region: Secondary | ICD-10-CM | POA: Diagnosis not present

## 2023-02-04 DIAGNOSIS — M9903 Segmental and somatic dysfunction of lumbar region: Secondary | ICD-10-CM | POA: Diagnosis not present

## 2023-02-09 DIAGNOSIS — E785 Hyperlipidemia, unspecified: Secondary | ICD-10-CM | POA: Diagnosis not present

## 2023-02-09 DIAGNOSIS — I10 Essential (primary) hypertension: Secondary | ICD-10-CM | POA: Diagnosis not present

## 2023-02-09 DIAGNOSIS — E1165 Type 2 diabetes mellitus with hyperglycemia: Secondary | ICD-10-CM | POA: Diagnosis not present

## 2023-03-11 ENCOUNTER — Other Ambulatory Visit: Payer: Self-pay

## 2023-03-11 ENCOUNTER — Ambulatory Visit: Payer: BC Managed Care – PPO | Admitting: Sports Medicine

## 2023-03-11 VITALS — BP 110/76 | Ht 67.0 in | Wt 153.0 lb

## 2023-03-11 DIAGNOSIS — M25512 Pain in left shoulder: Secondary | ICD-10-CM

## 2023-03-11 DIAGNOSIS — G8929 Other chronic pain: Secondary | ICD-10-CM

## 2023-03-11 DIAGNOSIS — M7552 Bursitis of left shoulder: Secondary | ICD-10-CM

## 2023-03-11 DIAGNOSIS — M7502 Adhesive capsulitis of left shoulder: Secondary | ICD-10-CM | POA: Diagnosis not present

## 2023-03-11 MED ORDER — METHYLPREDNISOLONE ACETATE 40 MG/ML IJ SUSP
40.0000 mg | Freq: Once | INTRAMUSCULAR | Status: AC
  Administered 2023-03-11: 40 mg via INTRA_ARTICULAR

## 2023-03-11 NOTE — Patient Instructions (Signed)
[facility] [address] [place], [place] [address] [phone]

## 2023-03-11 NOTE — Progress Notes (Signed)
PCP: Pomposini, Rande Brunt, MD  Subjective:   HPI: Patient is a 57 y.o. female with PMH prediabetes here for second opinion frozen shoulder.  She started having anterior shoulder pain about 6 weeks ago with atraumatic onset. She does note that she has been having problems with her shoulder off and on for about 20 years and she used to lift heavy weights. She recently started lifting again about a year ago but typically not heavy. She also lifts and carries her daughter with CP frequently. She has been moving her shoulder less after the onset of the pain and has noticed limitations in mobility.   She went to [facility] in late April and was diagnosed with adhesive capsulitis, they gave her an intraarticular steroid injection, but she does not note much relief from this. Plan was to get PT and then follow up and consider intra-articular fluoroscopic injection, though she has not gone back to see them since that point. She has been doing some shoudler exercises at home but has not seen PT. She is seeking a second opinion to to reassess and go over treatment options.  Past Medical History:  Diagnosis Date   Family history of adverse reaction to anesthesia    mother difficult to wake up   History of 2019 novel coronavirus disease (COVID-19) 09/2019   per pt positive, mild symptoms resolved   History of kidney stones    Labile essential hypertension    followed by pcp   Migraines    Right ureteral stone    Wears glasses     Current Outpatient Medications on File Prior to Visit  Medication Sig Dispense Refill   atenolol (TENORMIN) 25 MG tablet Take 25 mg by mouth at bedtime.     Multiple Vitamins-Minerals (MULTIVITAMIN ADULT) TABS Take 1 tablet by mouth daily.     No current facility-administered medications on file prior to visit.    Past Surgical History:  Procedure Laterality Date   ABDOMINAL HYSTERECTOMY  2004   CESAREAN SECTION  1984   CYSTOSCOPY W/ URETERAL STENT PLACEMENT Left  08/23/2018   Procedure: CYSTOSCOPY, URETEROSCOPY WITH STONE EXCAVATION AND LEFT URETERAL STENT PLACEMENT;  Surgeon: Bjorn Pippin, MD;  Location: [facility];  Service: Urology;  Laterality: Left;   CYSTOSCOPY WITH RETROGRADE PYELOGRAM, URETEROSCOPY AND STENT PLACEMENT Right 02/11/2021   Procedure: CYSTOSCOPY WITH RIGHT RETROGRADE PYELOGRAM, URETEROSCOPY WITH HOLMIUM LASER AND STENT PLACEMENT;  Surgeon: Crista Elliot, MD;  Location: [facility];  Service: Urology;  Laterality: Right;  REQUESTING 75 MINS   TONSILLECTOMY  child    No Known Allergies  There were no vitals taken for this visit.      No data to display              No data to display              Objective:  Physical Exam:  Gen: NAD, comfortable in exam room  MSK: Right shoulder No clear injuries or deformities.  Full ROM.  5/5 strength in abduction, internal and external rotation.  No sensory deficits.  Negative empty can, negative Neer and Hawkins.  Left shoulder No clear injuries or deformities.  No TTP.  AROM and PROM of abduction, external and internal rotation  mildly limited, 5/5 strength in abduction, internal and external rotation.  Reproducible pain on internal rotation with resistance.  Negative empty can, negative Neer and Hawkins.  Ultrasound left shoulder: Visualized biceps tendon in long and short axis, no obvious  hypoechoic changes or disruption to the fibers, AC joint visualized, no hypoechoic fluid surrounding the joint, preserved joint space supraspinatus tendon, visualized, no obvious hypoechoic changes or disruption to the fibers.  She does have a small amount of bursal fluid superior to the tendon infraspinatus and teres minor tendons, patient was visualized, no obvious hypoechoic changes or disruption of the fibers subscapularis visualized, no obvious hypoechoic changes or disruption of the tendon fibers.   Impression: Minimal hypoechoic fluid collection along supraspinatus  indicative of mild bursitis.    Left subacromial steroid injection: After informed written consent timeout was performed, patient was seated in exam room. Left shoulder was prepped with alcohol swab, patient's left subacromial space was injected with 4:1 lidocaine: depomedrol. Patient tolerated the procedure well without immediate complications.    Assessment & Plan:  1.  Left mild adhesive capsulitis and bursitis: Patient presents for a second opinion of left shoulder after initial diagnosis of adhesive capsulitis about 6 weeks ago.  She had intra-articular steroid injection at that time without much relief, and has been doing exercises at home.  Pain is restricting mobility, and she has some limitation of active and passive range of motion on exam indicative of adhesive capsulitis, but pretty mild overall.  Ultrasound shows mild bursitis as well.  Performed left subacromial steroid injection today to help with pain and recommended continuing exercises and PT.  Follow-up as needed to monitor progress.  I saw and evaluated this patient separate from the medical resident.  I agree with his physical exam, assessment and plan.  Patient was injected with subacromial cortisone shot today.  She was also given formal physical therapy order.  Will follow-up with her in 6 weeks.  Hopeful that her symptoms will have continued to improve.  Michelle A.  Leonor Liv, DO PGY 4, [facility] Fellow

## 2023-03-18 DIAGNOSIS — M25519 Pain in unspecified shoulder: Secondary | ICD-10-CM | POA: Diagnosis not present

## 2023-03-24 DIAGNOSIS — M25519 Pain in unspecified shoulder: Secondary | ICD-10-CM | POA: Diagnosis not present

## 2023-03-26 DIAGNOSIS — M25519 Pain in unspecified shoulder: Secondary | ICD-10-CM | POA: Diagnosis not present

## 2023-04-01 DIAGNOSIS — M25519 Pain in unspecified shoulder: Secondary | ICD-10-CM | POA: Diagnosis not present

## 2023-04-06 DIAGNOSIS — M25519 Pain in unspecified shoulder: Secondary | ICD-10-CM | POA: Diagnosis not present

## 2023-04-08 DIAGNOSIS — M25519 Pain in unspecified shoulder: Secondary | ICD-10-CM | POA: Diagnosis not present

## 2023-05-06 DIAGNOSIS — M25519 Pain in unspecified shoulder: Secondary | ICD-10-CM | POA: Diagnosis not present

## 2023-05-13 DIAGNOSIS — M25519 Pain in unspecified shoulder: Secondary | ICD-10-CM | POA: Diagnosis not present

## 2023-05-20 DIAGNOSIS — M25519 Pain in unspecified shoulder: Secondary | ICD-10-CM | POA: Diagnosis not present

## 2023-05-27 DIAGNOSIS — M25519 Pain in unspecified shoulder: Secondary | ICD-10-CM | POA: Diagnosis not present

## 2023-06-10 DIAGNOSIS — M25519 Pain in unspecified shoulder: Secondary | ICD-10-CM | POA: Diagnosis not present

## 2023-06-17 DIAGNOSIS — M25519 Pain in unspecified shoulder: Secondary | ICD-10-CM | POA: Diagnosis not present

## 2023-07-02 DIAGNOSIS — M25519 Pain in unspecified shoulder: Secondary | ICD-10-CM | POA: Diagnosis not present

## 2023-07-13 DIAGNOSIS — N3001 Acute cystitis with hematuria: Secondary | ICD-10-CM | POA: Diagnosis not present

## 2023-07-13 DIAGNOSIS — R300 Dysuria: Secondary | ICD-10-CM | POA: Diagnosis not present

## 2023-07-22 DIAGNOSIS — M25519 Pain in unspecified shoulder: Secondary | ICD-10-CM | POA: Diagnosis not present

## 2023-07-30 DIAGNOSIS — Z1231 Encounter for screening mammogram for malignant neoplasm of breast: Secondary | ICD-10-CM | POA: Diagnosis not present

## 2023-07-30 DIAGNOSIS — Z780 Asymptomatic menopausal state: Secondary | ICD-10-CM | POA: Diagnosis not present

## 2023-07-30 DIAGNOSIS — Z01419 Encounter for gynecological examination (general) (routine) without abnormal findings: Secondary | ICD-10-CM | POA: Diagnosis not present

## 2023-07-30 DIAGNOSIS — Z1212 Encounter for screening for malignant neoplasm of rectum: Secondary | ICD-10-CM | POA: Diagnosis not present

## 2023-07-31 ENCOUNTER — Other Ambulatory Visit: Payer: Self-pay | Admitting: Gynecology

## 2023-07-31 DIAGNOSIS — Z1231 Encounter for screening mammogram for malignant neoplasm of breast: Secondary | ICD-10-CM

## 2023-10-12 DIAGNOSIS — Z0000 Encounter for general adult medical examination without abnormal findings: Secondary | ICD-10-CM | POA: Diagnosis not present

## 2023-10-12 DIAGNOSIS — Z13220 Encounter for screening for lipoid disorders: Secondary | ICD-10-CM | POA: Diagnosis not present

## 2023-10-12 DIAGNOSIS — Z789 Other specified health status: Secondary | ICD-10-CM | POA: Diagnosis not present

## 2023-10-12 DIAGNOSIS — E782 Mixed hyperlipidemia: Secondary | ICD-10-CM | POA: Diagnosis not present

## 2023-10-12 DIAGNOSIS — Z114 Encounter for screening for human immunodeficiency virus [HIV]: Secondary | ICD-10-CM | POA: Diagnosis not present

## 2023-10-12 DIAGNOSIS — I10 Essential (primary) hypertension: Secondary | ICD-10-CM | POA: Diagnosis not present

## 2023-10-16 DIAGNOSIS — Z0000 Encounter for general adult medical examination without abnormal findings: Secondary | ICD-10-CM | POA: Diagnosis not present

## 2023-11-02 DIAGNOSIS — Z1231 Encounter for screening mammogram for malignant neoplasm of breast: Secondary | ICD-10-CM | POA: Diagnosis not present

## 2023-11-11 DIAGNOSIS — N6325 Unspecified lump in the left breast, overlapping quadrants: Secondary | ICD-10-CM | POA: Diagnosis not present

## 2023-11-11 DIAGNOSIS — R928 Other abnormal and inconclusive findings on diagnostic imaging of breast: Secondary | ICD-10-CM | POA: Diagnosis not present

## 2023-11-19 DIAGNOSIS — I10 Essential (primary) hypertension: Secondary | ICD-10-CM | POA: Diagnosis not present

## 2023-11-19 DIAGNOSIS — E1165 Type 2 diabetes mellitus with hyperglycemia: Secondary | ICD-10-CM | POA: Diagnosis not present

## 2023-11-19 DIAGNOSIS — Z789 Other specified health status: Secondary | ICD-10-CM | POA: Diagnosis not present

## 2023-11-19 DIAGNOSIS — Z111 Encounter for screening for respiratory tuberculosis: Secondary | ICD-10-CM | POA: Diagnosis not present

## 2023-11-19 DIAGNOSIS — E1121 Type 2 diabetes mellitus with diabetic nephropathy: Secondary | ICD-10-CM | POA: Diagnosis not present

## 2023-11-23 DIAGNOSIS — H40013 Open angle with borderline findings, low risk, bilateral: Secondary | ICD-10-CM | POA: Diagnosis not present

## 2023-11-23 DIAGNOSIS — H53143 Visual discomfort, bilateral: Secondary | ICD-10-CM | POA: Diagnosis not present

## 2024-02-23 DIAGNOSIS — E559 Vitamin D deficiency, unspecified: Secondary | ICD-10-CM | POA: Diagnosis not present

## 2024-02-23 DIAGNOSIS — R5383 Other fatigue: Secondary | ICD-10-CM | POA: Diagnosis not present

## 2024-02-23 DIAGNOSIS — E785 Hyperlipidemia, unspecified: Secondary | ICD-10-CM | POA: Diagnosis not present

## 2024-03-07 DIAGNOSIS — I10 Essential (primary) hypertension: Secondary | ICD-10-CM | POA: Diagnosis not present

## 2024-03-07 DIAGNOSIS — R739 Hyperglycemia, unspecified: Secondary | ICD-10-CM | POA: Diagnosis not present

## 2024-03-07 DIAGNOSIS — Z789 Other specified health status: Secondary | ICD-10-CM | POA: Diagnosis not present

## 2024-03-07 DIAGNOSIS — E1165 Type 2 diabetes mellitus with hyperglycemia: Secondary | ICD-10-CM | POA: Diagnosis not present

## 2024-03-07 DIAGNOSIS — E559 Vitamin D deficiency, unspecified: Secondary | ICD-10-CM | POA: Diagnosis not present

## 2024-05-11 DIAGNOSIS — N6315 Unspecified lump in the right breast, overlapping quadrants: Secondary | ICD-10-CM | POA: Diagnosis not present

## 2024-06-08 DIAGNOSIS — R739 Hyperglycemia, unspecified: Secondary | ICD-10-CM | POA: Diagnosis not present

## 2024-06-08 DIAGNOSIS — E782 Mixed hyperlipidemia: Secondary | ICD-10-CM | POA: Diagnosis not present

## 2024-06-14 DIAGNOSIS — U071 COVID-19: Secondary | ICD-10-CM | POA: Diagnosis not present

## 2024-06-14 DIAGNOSIS — B9689 Other specified bacterial agents as the cause of diseases classified elsewhere: Secondary | ICD-10-CM | POA: Diagnosis not present

## 2024-06-14 DIAGNOSIS — J069 Acute upper respiratory infection, unspecified: Secondary | ICD-10-CM | POA: Diagnosis not present

## 2024-06-29 DIAGNOSIS — E782 Mixed hyperlipidemia: Secondary | ICD-10-CM | POA: Diagnosis not present

## 2024-06-29 DIAGNOSIS — I10 Essential (primary) hypertension: Secondary | ICD-10-CM | POA: Diagnosis not present

## 2024-06-29 DIAGNOSIS — E1121 Type 2 diabetes mellitus with diabetic nephropathy: Secondary | ICD-10-CM | POA: Diagnosis not present

## 2024-06-29 DIAGNOSIS — E1165 Type 2 diabetes mellitus with hyperglycemia: Secondary | ICD-10-CM | POA: Diagnosis not present

## 2024-06-29 DIAGNOSIS — Z23 Encounter for immunization: Secondary | ICD-10-CM | POA: Diagnosis not present

## 2024-07-27 DIAGNOSIS — Z7185 Encounter for immunization safety counseling: Secondary | ICD-10-CM | POA: Diagnosis not present

## 2024-07-27 DIAGNOSIS — Z23 Encounter for immunization: Secondary | ICD-10-CM | POA: Diagnosis not present

## 2024-10-03 DIAGNOSIS — Z111 Encounter for screening for respiratory tuberculosis: Secondary | ICD-10-CM | POA: Diagnosis not present

## 2024-10-03 DIAGNOSIS — J069 Acute upper respiratory infection, unspecified: Secondary | ICD-10-CM | POA: Diagnosis not present
